# Patient Record
Sex: Male | Born: 1965 | Race: White | Hispanic: No | Marital: Single | Smoking: Current every day smoker
Health system: Southern US, Community
[De-identification: ages and names within clinical notes are randomized; demographics above are authoritative.]

## PROBLEM LIST (undated history)

## (undated) DIAGNOSIS — N390 Urinary tract infection, site not specified: Secondary | ICD-10-CM

## (undated) DIAGNOSIS — A419 Sepsis, unspecified organism: Secondary | ICD-10-CM

## (undated) DIAGNOSIS — F419 Anxiety disorder, unspecified: Secondary | ICD-10-CM

## (undated) DIAGNOSIS — R0600 Dyspnea, unspecified: Secondary | ICD-10-CM

## (undated) DIAGNOSIS — K219 Gastro-esophageal reflux disease without esophagitis: Secondary | ICD-10-CM

## (undated) DIAGNOSIS — F17200 Nicotine dependence, unspecified, uncomplicated: Secondary | ICD-10-CM

## (undated) DIAGNOSIS — F1010 Alcohol abuse, uncomplicated: Secondary | ICD-10-CM

## (undated) DIAGNOSIS — R519 Headache, unspecified: Secondary | ICD-10-CM

## (undated) HISTORY — DX: Anxiety disorder, unspecified: F41.9

## (undated) HISTORY — DX: Alcohol abuse, uncomplicated: F10.10

---
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
## 2003-02-09 LAB — CONVERTED CEMR LAB
ALT: 21 units/L
AST: 23 units/L
Albumin: 4.7 g/dL
BUN: 11 mg/dL
Bilirubin, Direct: 0.1 mg/dL
CO2: 22 meq/L
Calcium: 10 mg/dL
Chloride: 104 meq/L
Glucose, Bld: 111 mg/dL
Potassium: 4.5 meq/L
Sodium: 138 meq/L
Total Protein: 7.7 g/dL

## 2010-06-25 ENCOUNTER — Telehealth: Payer: Self-pay | Admitting: Family Medicine

## 2010-06-25 ENCOUNTER — Ambulatory Visit: Payer: Self-pay | Admitting: Family Medicine

## 2010-06-25 DIAGNOSIS — IMO0002 Reserved for concepts with insufficient information to code with codable children: Secondary | ICD-10-CM

## 2010-07-11 ENCOUNTER — Ambulatory Visit: Payer: Self-pay | Admitting: Internal Medicine

## 2010-08-13 ENCOUNTER — Ambulatory Visit: Payer: Self-pay | Admitting: Internal Medicine

## 2010-09-12 ENCOUNTER — Ambulatory Visit: Payer: Self-pay | Admitting: Internal Medicine

## 2010-09-12 ENCOUNTER — Encounter: Payer: Self-pay | Admitting: Family Medicine

## 2010-11-05 ENCOUNTER — Ambulatory Visit: Payer: Self-pay | Admitting: Internal Medicine

## 2010-11-23 ENCOUNTER — Ambulatory Visit
Admission: RE | Admit: 2010-11-23 | Discharge: 2010-11-23 | Payer: Self-pay | Source: Home / Self Care | Attending: Internal Medicine | Admitting: Internal Medicine

## 2010-12-07 ENCOUNTER — Ambulatory Visit: Admit: 2010-12-07 | Payer: Self-pay | Admitting: Family Medicine

## 2010-12-12 ENCOUNTER — Telehealth: Payer: Self-pay | Admitting: Family Medicine

## 2010-12-17 ENCOUNTER — Encounter: Payer: Self-pay | Admitting: Family Medicine

## 2010-12-25 NOTE — Progress Notes (Signed)
Summary: outgoing call for couseling  Phone Note Outgoing Call Call back at [facility] Phone [phone]   Summary of Call: Called to discuss option of counseling services at [facility].  Pt with financial concerns, but had expressed interest in couseling services.  I wanted to offer couseling at our facility w/ Dr. Luiz Blare if he was interested.  Apparently [facility] has indigent program based on pt's abilty to pay.  Asked to call us back to further discuss. Initial call taken by: Eustaquio Boyden  MD,  June 25, 2010 4:37 PM  Follow-up for Phone Call        pt called back.  not interested in counseling currently 2/2 financial concerns.  will think about it and get back to me next visit about it. Follow-up by: Eustaquio Boyden  MD,  June 25, 2010 4:52 PM

## 2010-12-25 NOTE — Letter (Signed)
Summary: Medical Report Form/NCDMV  Medical Report Form/NCDMV   Imported By: Lanelle Bal 09/20/2010 14:08:39  _____________________________________________________________________  External Attachment:    Type:   Image     Comment:   External Document
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
## 2010-12-25 NOTE — Assessment & Plan Note (Signed)
Summary: ONE MONTH FOLLOW UP / LFW   Vital Signs:  Patient profile:   45 year old male Weight:      145.50 pounds Temp:     97.6 degrees F oral Pulse rate:   64 / minute Pulse rhythm:   regular BP sitting:   110 / 60  (left arm) Cuff size:   regular  Vitals Entered By: Selena Batten Dance CMA (AAMA) (September 12, 2010 8:05 AM) CC: 1 month follow up  Vision Screening:Left eye w/o correction: 20 / 15 Right Eye w/o correction: 20 / 20 Both eyes w/o correction:  20/ 20 Left eye with correction: 20 / 20 Right eye with correction: 20 / 15 Both eyes with correction: 20 / 20        Vision Entered By: Selena Batten Dance CMA Duncan Dull) (September 12, 2010 8:54 AM)   History of Present Illness: CC: f/u depression/anxiety   Feels depression going very well.    Anxiety - still feels gets slight panic attacks when around people.  Still not interested in counseling.  main concern today is form which he brings from [employer] regarding h/o EtOH abuse and mental illness that needs me to fill out, upset because had to fill out for several years but not for last 2 years, asks if I sent in anything to [employer] that made them start requiring form filled out again.  Still abstinent from EtOH since 09/2009.  PHQ 9 = 19->12->6 GAD 7 = 12-->10-->7  smoking - still 2ppd.  precontemplative  Allergies: No Known Drug Allergies  Past History:  Past Medical History: Alcoholism s/p DWI x 1 (charged not convicted) Depression/Anxiety (previously on seroquel, effexor, lexapro, zoloft, xanax, pristiq) Dx with emphysema 3-4 years ago (was on albuterol, no longer), never had PFTs.  doesn't feel can currently afford meds/workup PMH-FH-SH reviewed for relevance  Review of Systems       per HPI  Physical Exam  General:  vitals reviewed and normal.  thin, well developed Psych:  Cognition and judgment appear intact. Alert and cooperative with normal attention span and concentration. No apparent delusions, illusions, hallucinations   Calm, full affect.  not depressed appearing.   Impression & Recommendations:  Problem # 1:  DEPRESSION/ANXIETY (ICD-300.4) Improved depression based on PHQ-9 from 17 to 12 to 6.  Improved anxiety based on GAD-7 with score from 12 to 10 to 7.    Pt has noted substantial improvement on Celexa 40mg .  Not noted much improvement in anxiety on buspar, however GAD-7 with improvement.  Still not intersetd in counseling which I feel he would benefit from talking through several other issues in past (h/o abuse, EtOH, etc).  Would want to check TSH but will hold off for now given financial concerns.  will offer at next visit (TSH + draw =  ~50$).  staying away from benzos given h/o EtOHism.  would consider klonopin if celexa/buspar not controlling anxiety adequately.  increase busar to 7.5mg  two times a day.  question of bipolar per previous provider, however I have not noticed any sxs of such.  will continue to monitor.  also sees psychiatrist, last 2 years ago.  doesn't remember his name.  Problem # 2:  ALCOHOL ABUSE, HX OF (ICD-V11.3) h/o EtOH abuse, abstinent since 09/2009.  very proud of this.  compliant with all meds and taking as directed. I don't feel he should be limited in driving.  filled out forms with patient, passed vision screen and only farsighted so no need for visual  specialist evaluation.  last eye screen was 4 mo ago, told didn't need glasses to drive only to read.  Complete Medication List: 1)  Citalopram Hydrobromide 40 Mg Tabs (Citalopram hydrobromide) .... One by mouth daily for depression/anxiety 2)  Buspirone Hcl 7.5 Mg Tabs (Buspirone hcl) .... One in am and one at night for anxiety  Patient Instructions: 1)  return in 2-3 months for follow up, sooner if needed. 2)  increase buspar to one in AM and one at PM   3)  Good to see you today.  call clinic with quesitons. Prescriptions: CITALOPRAM HYDROBROMIDE 40 MG TABS (CITALOPRAM HYDROBROMIDE) one by mouth daily for  depression/anxiety  #30 x 3   Entered and Authorized by:   Eustaquio Boyden  MD   Signed by:   Eustaquio Boyden  MD on 09/12/2010   Method used:   Electronically to        [facility]* (retail)       [address]       [place], [place]  [address]       Ph: [phone]       Fax: [phone]   RxID:   [id number] BUSPIRONE HCL 7.5 MG TABS (BUSPIRONE HCL) one in am and one at night for anxiety  #60 x 3   Entered and Authorized by:   Eustaquio Boyden  MD   Signed by:   Eustaquio Boyden  MD on 09/12/2010   Method used:   Electronically to        [facility]* (retail)       [address]       [place], [place]  [address]       Ph: [phone]       Fax: [phone]   RxID:   [id number]    Orders Added: 1)  Est. Patient Level III [93716]    Current Allergies (reviewed today): No known allergies

## 2010-12-25 NOTE — Assessment & Plan Note (Signed)
Summary: ONE MONTH FOLLOW UP / LFW   Vital Signs:  Patient profile:   45 year old male Weight:      144.75 pounds Temp:     97.9 degrees F oral Pulse rate:   60 / minute Pulse rhythm:   regular BP sitting:   108 / 70  (left arm) Cuff size:   regular  Vitals Entered By: Selena Batten Dance CMA Duncan Dull) (August 13, 2010 8:05 AM) CC: Follow up   History of Present Illness: CC: f/u anxiety/depression  Scott Fitzgerald presents for 1 mo f/u anxiety/depression.  On celexa 40mg  daily feels doing overall better. Still getting panic attacks occasionally and feeling restless.  Feels drained on celexa - low energy and sleeping too much.  Does walk some but feels could walk more.  Notes sleeping during day.  On MVI and vit E.  Has lost account, worried about work.  No HI.  + mild SI but no plan.  Doesn't think would do anything.  Noted 6 lb weight gain.  Still not interested in counseling.  PHQ 9 = 12 (improved from 19 last visit) GAD 7 = 10 (from 12)  smoking - still 2ppd.  precontemplative  Allergies (verified): No Known Drug Allergies PMH-FH-SH reviewed for relevance  Social History: Reviewed history from 06/25/2010 and no changes required. 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 +2 ppd smoker remote MJ use + EtOH abuse, sober since 09/2009  Review of Systems       per HPI  Physical Exam  General:  vitals reviewed and normal.  thin, well developed, fidgety Psych:  Cognition and judgment appear intact. Alert and cooperative with normal attention span and concentration. No apparent delusions, illusions, hallucinations  Calm, full affect.  not depressed appearing.   Impression & Recommendations:  Problem # 1:  DEPRESSION/ANXIETY (ICD-300.4) Improved depression based on PHQ-9 from 17 to 12.  Improved anxiety based on GAD-7 with score from 12 to 10.    Pt has noted substantial improvement on Celexa 40mg .  Requests additional anxiety med to help with anxiety attacks and  becaues currently avoiding meeting people, which he needs to do for work.  Advised to continue thinking about counseling as I feel he would benefit from talking through several other issues in past (h/o abuse, EtOH, etc).  Also advised to sleep less during day so he has productive nightly rest.  Would want to check TSH but will hold off for now given financial concerns.  will offer at next visit (TSH + draw =  ~50$).  staying away from benzos given h/o EtOHism.  would consider klonopin if celexa/buspar not controlling anxiety adequately.  Start buspar 10mg  nightly.  question of bipolar per previous provider, however I have not noticed any sxs of such.  will continue to monitor.  Problem # 2:  TOBACCO ABUSE (ICD-305.1) Encouraged smoking cessation.  Pt has quit line.  Problem # 3:  ALCOHOL ABUSE (ICD-305.00)  sober x 10 1/2 mo.  would stay away from benzo's.  Problem # 4:  Preventive Health Care (ICD-V70.0) declines flu.  Complete Medication List: 1)  Citalopram Hydrobromide 40 Mg Tabs (Citalopram hydrobromide) .... One by mouth daily for depression/anxiety 2)  Buspirone Hcl 10 Mg Tabs (Buspirone hcl) .... Take one by mouth at bedtime  Patient Instructions: 1)  Return in 1 month for follow up. 2)  Start Buspar 10mg  at night 3)  Start going outside more to exercise during day and try to sleep only at  night.  goal 8-9 hours a day. 4)  Keep thinking about quitting, let us know if you want help. 5)  Pleasure to see you today.  Call clinic with quesitons. Prescriptions: CITALOPRAM HYDROBROMIDE 40 MG TABS (CITALOPRAM HYDROBROMIDE) one by mouth daily for depression/anxiety  #30 x 3   Entered and Authorized by:   Eustaquio Boyden  MD   Signed by:   Eustaquio Boyden  MD on 08/13/2010   Method used:   Electronically to        [facility]* (retail)       [address]       [place], [place]  [address]       Ph: [phone]       Fax: [phone]   RxID:    [id number] BUSPIRONE HCL 10 MG TABS (BUSPIRONE HCL) take one by mouth at bedtime  #30 x 1   Entered and Authorized by:   Eustaquio Boyden  MD   Signed by:   Eustaquio Boyden  MD on 08/13/2010   Method used:   Electronically to        [facility]* (retail)       [address]       [place], [place]  [address]       Ph: [phone]       Fax: [phone]   RxID:   [id number]   Current Allergies (reviewed today): No known allergies

## 2010-12-27 NOTE — Miscellaneous (Signed)
Summary: Advair addition to med list.  Clinical Lists Changes  Medications: Added new medication of ADVAIR DISKUS 100-50 MCG/DOSE AEPB (FLUTICASONE-SALMETEROL) 1 puff two times a day. Rinse after use.     Prior Medications: TRAZODONE HCL 50 MG TABS (TRAZODONE HCL) take one nightly EFFEXOR XR 37.5 MG XR24H-CAP (VENLAFAXINE HCL) one daily for 2 weeks then 2 in am. VENTOLIN HFA 108 (90 BASE) MCG/ACT AERS (ALBUTEROL SULFATE) 2 puffs q6 hours as needed wheezing/sob ADVAIR DISKUS 100-50 MCG/DOSE AEPB (FLUTICASONE-SALMETEROL) 1 puff two times a day. Rinse after use. Current Allergies: No known allergies

## 2010-12-27 NOTE — Assessment & Plan Note (Signed)
Summary: NERVES/ANXIETY/JRR   Vital Signs:  Patient profile:   45 year old male Weight:      151.75 pounds Temp:     97.8 degrees F oral Pulse rate:   64 / minute Pulse rhythm:   regular BP sitting:   110 / 80  (left arm) Cuff size:   regular  Vitals Entered By: Selena Batten Dance CMA Duncan Dull) (November 05, 2010 8:05 AM) CC: Nerves/Anxiety   History of Present Illness: CC: nerves are bad  Anxiety bad, legs hurting, nauseated.  Thanksgiving - couldn't go to family's house because of anxiety.  When around people feels anxious.  weight up 6 lbs.  Work bad - thinks about it, then becomes cycle where he over thinks things, doesn't let him worry well.  Thinks buspar causing leg pain and HA.  Tried to back off.  Thinks celexa not really helping.  Having trouble starting to sleep.  Has been on trazodone and doing well.  + daily HA - taking 3 aspirins daily.  Frontal headache.  Still abstaining from drinking.  smoking has increased.  Allergies: No Known Drug Allergies  Past History:  Past Medical History: Last updated: 09/12/2010 Alcoholism s/p DWI x 1 (charged not convicted) Depression/Anxiety (previously on seroquel, effexor, lexapro, zoloft, xanax, pristiq) Dx with emphysema 3-4 years ago (was on albuterol, no longer), never had PFTs.  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 +2 ppd smoker remote MJ use + EtOH abuse, sober since 09/2009  Review of Systems       per HPI  Physical Exam  General:  vitals reviewed and normal.  well developed Lungs:  Normal respiratory effort, chest expands symmetrically. Lungs are clear to auscultation, no crackles or wheezes. Heart:  Normal rate and regular rhythm. S1 and S2 normal without gallop, murmur, click, rub or other extra sounds. Psych:  Cognition and judgment appear intact. Alert and cooperative with normal attention span and  concentration. No apparent delusions, illusions, hallucinations  Calm, full affect.  not depressed appearing.   Impression & Recommendations:  Problem # 1:  DEPRESSION/ANXIETY (ICD-300.4) Assessment Deteriorated deteriorated based on discussion, sxs.  didn't tolerate buspar - HA, leg pain, nausea.  Stop.  Start trazodone to help with sleep.  Pt desires to change from celexa (doesn't feel helping any more) to effexor.  would like better control of anxiety.  could consider augmentation with seroquel.  again encouraged psychotherapy, pt doesn't want or feel could afford.  RTC 1 mo.    A total of 25 minutes were spent face-to-face with the patient during this encounter and over half of that time was spent on counseling and coordination of care   previous scores - PHQ-9 from 17 to 12 to 6.  Improved anxiety based on GAD-7 with score from 12 to 10 to 7.  today not done.  question of bipolar per previous provider, however I have not noticed any sxs of such.  will continue to monitor.  Complete Medication List: 1)  Citalopram Hydrobromide 20 Mg Tabs (Citalopram hydrobromide) .... One daily for 2 weeks then stop 2)  Trazodone Hcl 50 Mg Tabs (Trazodone hcl) .... Take one nightly 3)  Effexor Xr 37.5 Mg Xr24h-cap (Venlafaxine hcl) .... One daily for 2 weeks then 2 in am.  Patient Instructions: 1)  Return in 1 month for follow up. 2)  Stop buspar. 3)  Decrease celexa to 20mg  daily x 2 weeks.  Then stop. 4)  Start Effexor XR 37.5mg  one daily x 2 weeks. Then increase to Two pills in am. 5)  Start trazodone 50mg  nightly for sleep. 6)  Think about counseling. Prescriptions: EFFEXOR XR 37.5 MG XR24H-CAP (VENLAFAXINE HCL) one daily for 2 weeks then 2 in am.  #60 x 1   Entered and Authorized by:   Eustaquio Boyden  MD   Signed by:   Eustaquio Boyden  MD on 11/05/2010   Method used:   Electronically to        [facility]* (retail)       [address]       [place], [place]   [address]       Ph: [phone]       Fax: [phone]   RxID:   [id number] CITALOPRAM HYDROBROMIDE 40 MG TABS (CITALOPRAM HYDROBROMIDE) one by mouth daily for depression/anxiety  #30 x 3   Entered and Authorized by:   Eustaquio Boyden  MD   Signed by:   Eustaquio Boyden  MD on 11/05/2010   Method used:   Electronically to        [facility]* (retail)       [address]       [place], [place]  [address]       Ph: [phone]       Fax: [phone]   RxID:   [id number] TRAZODONE HCL 50 MG TABS (TRAZODONE HCL) take one nightly  #30 x 3   Entered and Authorized by:   Eustaquio Boyden  MD   Signed by:   Eustaquio Boyden  MD on 11/05/2010   Method used:   Electronically to        [facility]* (retail)       [address]       [place], [place]  [address]       Ph: [phone]       Fax: [phone]   RxID:   [id number]    Orders Added: 1)  Est. Patient Level IV [76195]    Current Allergies (reviewed today): No known allergies

## 2010-12-27 NOTE — Progress Notes (Signed)
Summary: needs samples of advair  Phone Note Call from Patient Call back at [facility] Phone [phone]   Caller: Patient Summary of Call: Pt states he was given 2 samples of advair at last office visit and he is asking for more, says he needs 2 to last him a month.  He says this really helps but he cant afford prescription or office visit.  There is no mention in pt's chart that he was given advair, pt doesnt know the dose but he said the disk was purple.  Initial call taken by: Lowella Petties CMA, AAMA,  December 12, 2010 11:01 AM  Follow-up for Phone Call        It's in the last note.  Please have patient verify the dose and please give him 1 months of sampes if available.  1 puff two times a day. Rinse after use.  Please add to med list.  Follow-up by: Crawford Givens MD,  December 12, 2010 12:34 PM  Additional Follow-up for Phone Call Additional follow up Details #1::        Called patient to ask dosage.  He doesn't think he still has one of the canisters; he says he thinks he threw it away.  He asks if he could  have whatever dosage we might have on hand? He is going to try to find the canister and call back.  Lugene Fuquay CMA (AAMA)  December 12, 2010 1:13 PM   Patient called back and siad that he has 150. Melody Comas  December 12, 2010 1:37 PM     Additional Follow-up for Phone Call Additional follow up Details #2::    I looked and we don't have any advair of any strength.  I would defer to Dr. Reece Agar next week about what he would prefer to do.  I don't want to change his meds w/o having seen the patient.  Follow-up by: Crawford Givens MD,  December 12, 2010 1:36 PM  Additional Follow-up for Phone Call Additional follow up Details #3:: Details for Additional Follow-up Action Taken: Left message on voicemail  in detail.  Personalized VM. Lugene Fuquay CMA (AAMA)  December 12, 2010 4:25 PM   plz call and notify samples of advair available for pickup.   1 puff two times a day. Rinse after use.   Please add to med list.   Eustaquio Boyden  MD  December 17, 2010 8:24 AM   Left message notifying patient of samples that will be up front for pick up. Added to med list. Additional Follow-up by: Janee Morn CMA Duncan Dull),  December 17, 2010 8:57 AM

## 2010-12-27 NOTE — Assessment & Plan Note (Signed)
Summary: ASTHMA FLARE UP/ 2:45   Vital Signs:  Patient profile:   45 year old male Weight:      146 pounds O2 Sat:      97 % on Room air Temp:     98.0 degrees F oral Pulse rate:   76 / minute Pulse rhythm:   regular BP sitting:   130 / 70  (left arm) Cuff size:   regular  Vitals Entered By: Selena Batten Dance CMA (AAMA) (November 23, 2010 2:18 PM)  O2 Flow:  Room air CC: Asthma and/or anxiety Comments Feels like he can't catch his breath at night. Anxiety is getting worse.   History of Present Illness: CC: asthma/anxiety?  Tuesday night trouble breathing, had to call EMT.  given breathing treatment.  told had asthma or emphysema by prior PCP.  Now losing voice.  No fevers/chills, congestion, cough, cold sxs.  No sick contacts.  worsening panic attacks - vomiting, sweating, sob, shaking more.  No chest tightness.  + chest burning heart burn.  peptobismol doesn't help vomiting.  smoking 1 1/2 -2 ppd.  Current Medications (verified): 1)  Trazodone Hcl 50 Mg Tabs (Trazodone Hcl) .... Take One Nightly 2)  Effexor Xr 37.5 Mg Xr24h-Cap (Venlafaxine Hcl) .... One Daily For 2 Weeks Then 2 in Am.  Allergies (verified): No Known Drug Allergies  Past History:  Past Medical History: Alcoholism s/p DWI x 1 (charged not convicted) Depression/Anxiety (previously on seroquel, effexor, lexapro, zoloft, xanax, pristiq, celexa) Dx with emphysema/asthma 3-4 years ago (was on albuterol, no longer), never had PFTs.  doesn't feel can currently afford meds/workup  Social History: Reviewed history from 06/25/2010 and no changes required. 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 +2 ppd smoker remote MJ use + EtOH abuse, sober since 09/2009  Review of Systems       per HPI  Physical Exam  General:  vitals reviewed and normal.  well developed Head:  Normocephalic and atraumatic without obvious abnormalities.  Eyes:  PERRLA, EOMI, no injection Ears:  Tms clear  bilaterally Mouth:  Oral mucosa and oropharynx without lesions or exudates.  poor dentition Neck:  No deformities, masses, or tenderness noted. Lungs:  coarse breath sounds throughout, + some wheezing as well as mild rhonchi Heart:  Normal rate and regular rhythm. S1 and S2 normal without gallop, murmur, click, rub or other extra sounds. Pulses:  2+ rad pulses, brisk cap refill Extremities:  No clubbing, cyanosis, edema, or deformity noted with normal full range of motion of all joints.   Psych:  Cognition and judgment appear intact. Alert and cooperative with normal attention span and concentration. No apparent delusions, illusions, hallucinations  Calm, full affect.  not depressed appearing.   Impression & Recommendations:  Problem # 1:  DYSPNEA (ICD-786.05) previous dx emphysema.  pt thinks has asthma as well.  more likely COPD/emphysema given long smoking hsitory..  pt doesn't feel can afford w/u currently.  start albuterol, advair to see if can get better control of breathing condition.  consider PF pre/post next visit.  O2 sat 97% RA today.  Problem # 2:  TOBACCO ABUSE (ICD-305.1)  Encouraged smoking cessation.  pt in action today.  discussed chantix, doesn't think could handle possible side effects curretnly, opts to try e cigarrette first.  Problem # 3:  DEPRESSION/ANXIETY (ICD-300.4) along with panic attacks now.  Taking trazodone as needed. advised start daily, continue effexor.  if not better, would consider psych although may not be able to afford.  could consider augmentation with seroquel.  again encouraged psychotherapy, pt doesn't want or feel could afford.    A total of 25 minutes were spent face-to-face with the patient during this encounter and over half of that time was spent on counseling and coordination of care   previous scores - PHQ-9 from 17 to 12 to 6.  Improved anxiety based on GAD-7 with score from 12 to 10 to 7.  today not done.  question of bipolar per  previous provider, however I have not noticed any sxs of such.  will continue to monitor.  Complete Medication List: 1)  Trazodone Hcl 50 Mg Tabs (Trazodone hcl) .... Take one nightly 2)  Effexor Xr 37.5 Mg Xr24h-cap (Venlafaxine hcl) .... One daily for 2 weeks then 2 in am. 3)  Ventolin Hfa 108 (90 Base) Mcg/act Aers (Albuterol sulfate) .... 2 puffs q6 hours as needed wheezing/sob  Patient Instructions: 1)  Restart albuterol as needed for breathing. 2)  Start advair twice daily. 3)  Nicotine replacement - look into patches 1 /day or gum. 4)  Let us know how you are doing. Prescriptions: VENTOLIN HFA 108 (90 BASE) MCG/ACT AERS (ALBUTEROL SULFATE) 2 puffs q6 hours as needed wheezing/sob  #1 x 3   Entered and Authorized by:   Eustaquio Boyden  MD   Signed by:   Eustaquio Boyden  MD on 11/23/2010   Method used:   Electronically to        [facility]* (retail)       [address]       [place], [place]  [address]       Ph: [phone]       Fax: [phone]   RxID:   [id number]    Orders Added: 1)  Est. Patient Level IV [27253]    Current Allergies (reviewed today): No known allergies   Appended Document: ASTHMA FLARE UP/ 2:45 (changed to lvl 3)

## 2011-02-07 ENCOUNTER — Encounter: Payer: Self-pay | Admitting: Family Medicine

## 2011-02-07 DIAGNOSIS — J439 Emphysema, unspecified: Secondary | ICD-10-CM | POA: Insufficient documentation

## 2011-02-07 DIAGNOSIS — F418 Other specified anxiety disorders: Secondary | ICD-10-CM

## 2011-02-07 DIAGNOSIS — F419 Anxiety disorder, unspecified: Secondary | ICD-10-CM | POA: Insufficient documentation

## 2011-02-07 DIAGNOSIS — J45909 Unspecified asthma, uncomplicated: Secondary | ICD-10-CM | POA: Insufficient documentation

## 2011-02-07 DIAGNOSIS — F1011 Alcohol abuse, in remission: Secondary | ICD-10-CM | POA: Insufficient documentation

## 2011-02-07 DIAGNOSIS — F1010 Alcohol abuse, uncomplicated: Secondary | ICD-10-CM

## 2011-07-05 ENCOUNTER — Other Ambulatory Visit: Payer: Self-pay | Admitting: *Deleted

## 2011-07-05 NOTE — Telephone Encounter (Signed)
Ok to refill in Dr. Sharen Hones' absence?

## 2011-07-07 MED ORDER — TRAZODONE HCL 50 MG PO TABS: 50.0000 mg | ORAL_TABLET | Freq: Every day | ORAL | Status: DC

## 2011-07-07 NOTE — Telephone Encounter (Signed)
Have him schedule f/u with PMD.  rx sent.

## 2011-07-08 ENCOUNTER — Other Ambulatory Visit: Payer: Self-pay | Admitting: *Deleted

## 2011-07-08 MED ORDER — TRAZODONE HCL 50 MG PO TABS: 50.0000 mg | ORAL_TABLET | Freq: Every day | ORAL | Status: DC

## 2011-07-08 NOTE — Telephone Encounter (Signed)
Spoke with patient's grandmother. DPR form signed giving permission. She just said he was nervous and trembling. He didn't sleep last night but did go to work today. She said she is not worried about SI/HI and isn't afraid for her safety. She just said he was very nervous/anxious. Dr. Para March had actually refilled this Friday in your absence, so it was already at the pharmacy for him.

## 2011-07-08 NOTE — Telephone Encounter (Signed)
Noted. Thanks.  Needs ov.

## 2011-07-08 NOTE — Telephone Encounter (Signed)
Ok to send in.  Can we call pt's grandmother for update, reminding her we cannot discuss med record with her unless he's signed form permitting [place] to.

## 2011-07-08 NOTE — Telephone Encounter (Signed)
Pt's grandmother states pt has been out of this for several weeks and is in a bad way, very nervous.  She says he is about to have a nervous breakdown and she would like for you to call her when you can.

## 2011-07-12 NOTE — Telephone Encounter (Signed)
Scott Fitzgerald, has pt been notified that he needs office visit?

## 2011-07-12 NOTE — Telephone Encounter (Signed)
Yes. I left him a message and I am waiting on a return call from him. Thanks for checking!

## 2011-07-18 ENCOUNTER — Ambulatory Visit (INDEPENDENT_AMBULATORY_CARE_PROVIDER_SITE_OTHER): Payer: Self-pay | Admitting: Family Medicine

## 2011-07-18 ENCOUNTER — Encounter: Payer: Self-pay | Admitting: Family Medicine

## 2011-07-18 VITALS — BP 122/80 | HR 104 | Temp 98.2°F | Wt 142.8 lb

## 2011-07-18 DIAGNOSIS — F4010 Social phobia, unspecified: Secondary | ICD-10-CM

## 2011-07-18 DIAGNOSIS — F410 Panic disorder [episodic paroxysmal anxiety] without agoraphobia: Secondary | ICD-10-CM

## 2011-07-18 HISTORY — DX: Social phobia, unspecified: F40.10

## 2011-07-18 MED ORDER — CLONAZEPAM 0.5 MG PO TABS: 0.5000 mg | ORAL_TABLET | Freq: Two times a day (BID) | ORAL | Status: DC | PRN

## 2011-07-18 MED ORDER — ALPRAZOLAM 0.5 MG PO TABS: 0.5000 mg | ORAL_TABLET | Freq: Every day | ORAL | Status: DC | PRN
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
## 2011-07-18 NOTE — Patient Instructions (Addendum)
Continue trazodone at night. Start klonopin 0.5mg  twice daily to help with anxiety level. Xanax for severe anxiety.  This is just temporary measure while klonopin takes effect. Cut back on smoking! Return to see me in 1 month.

## 2011-07-18 NOTE — Assessment & Plan Note (Signed)
Acute panic attack in setting of anxiety > depression. Trial of several meds in past, poor response. States has responded well to xanax in past, however hesitance given h/o EtOH abuse. Discussed similarities of benzos and EtOH, however given pt has failed several other antidepressants/antianxiety meds, start klonopin bid, xanax #10 provided for temporary relief of severe anxiety attacks. Advised to call me in next few days with update, to return in 1-3 mo for f/u. Pt feels anxiety attacks impeding ability to function in social setting, feels on verge of financial ruin 2/2 anxiety, asks about disability.  Advised to look into this.

## 2011-07-22 ENCOUNTER — Telehealth: Payer: Self-pay | Admitting: Family Medicine

## 2011-07-22 MED ORDER — ALPRAZOLAM 0.5 MG PO TABS: 0.5000 mg | ORAL_TABLET | Freq: Two times a day (BID) | ORAL | Status: DC | PRN

## 2011-07-22 NOTE — Telephone Encounter (Signed)
Rx called in as directed and message left notifying patient. 

## 2011-07-22 NOTE — Telephone Encounter (Signed)
Pt requested call back this am - called at [phone], no answer.  Will route to [employer].

## 2011-07-22 NOTE — Telephone Encounter (Signed)
Spoke with patient and advised him to take meds only as prescribed. He said he hasn't noticed any sedation. I told him to take 2 in the AM and 2 in the PM. He only has 2 of the xanax left because of severe anxiety attacks since his visit with you the other day. He said something is starting to work though because he is starting to feel like he can socialize and get out and work now. He has worked some over the last few days. He did ask for a refill on the xanax and the klonopin. I told him I would ask about the xanax but since he still had 37 of the klonopin left, it couldn't be refilled just yet. I also reminded him not to take more than what you tell him because if he runs out too early, it can't be refilled. He verbalized understanding and said he would call with an update in 3 days.

## 2011-07-22 NOTE — Telephone Encounter (Signed)
Any sedating effect on med?  Needs to take med only as prescribed.  May increase klonopin to 2 pills in am and 2 in evening (4 total daily instead of 2).  Call me in 3 days with update. How many xanax left?

## 2011-07-22 NOTE — Telephone Encounter (Signed)
Noted.  May phone in xanax #20.

## 2011-07-22 NOTE — Telephone Encounter (Signed)
Patient was told to call and give you an update on how he is doing. Patient states that the Clonzaepam that he was given is not strong enough and he has taken more than what was prescribed. Patient got #60 five days ago and only has 37 left. Patient thinks that he needs something stronger.

## 2011-07-26 ENCOUNTER — Telehealth: Payer: Self-pay | Admitting: *Deleted

## 2011-07-26 MED ORDER — CLONAZEPAM 0.5 MG PO TABS: 1.0000 mg | ORAL_TABLET | Freq: Two times a day (BID) | ORAL | Status: DC

## 2011-07-26 NOTE — Telephone Encounter (Signed)
Please verify he's off effexor (was on med list). Have him take 2 klonopin twice daily.  Continue current xanax dose as is over weekend. To call us Tuesday with update.   If not improved, will taper off klonopin and restart celexa, using xanax as breakthrough anxiety med.

## 2011-07-26 NOTE — Telephone Encounter (Signed)
Called and had to leave a message notifying the patient. Instructed him to call on Tuesday with any update and to verify about effexor. Instructed him to go to [facility] or ER if he had any major problems over the long weekend.

## 2011-07-26 NOTE — Telephone Encounter (Signed)
Spoke with patient. He says his anxiety is still not getting much better. He feels like his mind is racing all the time and he can't focus. He says with his job and financial issues he is staying keyed up all the time. When he has to be around people is when things are the worst. He had to call in sick today because of it. The klonopin works a little but not much. He feels like he needs something else to go with it. He has already used 10 of the xanax that were prescribed to him on the 27th. He said he has taken up to 3 at a time. I told him he COULD NOT do that anymore due to risk of overdose. He verbalized understanding but said that was the only thing that seemed to work really good for him. He kept asking if he could have something that worked immediately and he suggested 1 mg xanax.  I told him I wasn't sure if you would prescribe that or try something totally different. I told him one of [place] would call him later this afternoon.

## 2011-07-31 ENCOUNTER — Telehealth: Payer: Self-pay | Admitting: *Deleted

## 2011-07-31 MED ORDER — CLONAZEPAM 0.5 MG PO TABS: 1.5000 mg | ORAL_TABLET | Freq: Two times a day (BID) | ORAL | Status: DC

## 2011-07-31 MED ORDER — CITALOPRAM HYDROBROMIDE 20 MG PO TABS: 20.0000 mg | ORAL_TABLET | Freq: Every day | ORAL | Status: DC

## 2011-07-31 NOTE — Telephone Encounter (Signed)
Pt taking 2 pills of 0.5 mg bid. Not controlled anxiety. I want [place] to start celexa - 20mg  daily for better control of anxiety.  Has tolerated celexa in past, hopeful for improvement with klonopin on board. Will also increase to 3 pills of klonopin 0.5mg  twice daily.  To call us 1 wk prior to running out of current klonopin script.

## 2011-07-31 NOTE — Telephone Encounter (Signed)
Message left notifying patient to increase to 3 klonopin twice daily and to take celexa once daily. Instructed to call 1 week before running out of the klonopin for a refill of a different strength. Instructed to call with any questions or concerns.

## 2011-07-31 NOTE — Telephone Encounter (Signed)
Patient called and said he feels like the klonopin isn't strong enough for him. He is having to take his 2nd dose around 1 or 2 PM. He thinks he needs a stronger dose to help him go longer in between doses.

## 2011-08-09 ENCOUNTER — Telehealth: Payer: Self-pay | Admitting: *Deleted

## 2011-08-09 MED ORDER — CITALOPRAM HYDROBROMIDE 20 MG PO TABS: 20.0000 mg | ORAL_TABLET | Freq: Every day | ORAL | Status: DC

## 2011-08-09 MED ORDER — CLONAZEPAM 1 MG PO TABS: 1.5000 mg | ORAL_TABLET | Freq: Two times a day (BID) | ORAL | Status: DC

## 2011-08-09 NOTE — Telephone Encounter (Signed)
Don't recommend anything OTC to help with ED.  Will refill klonopin, increase dose to 1mg  so will need 1 1/2 pills bid.  Please phone in script in chart. Continue celexa, give this med more time to work.  Will remove xanax from list as that was only temporary measure while we got these longer acting meds on board.

## 2011-08-09 NOTE — Telephone Encounter (Signed)
Patient called with an update. He said he has had 4 short attacks since new dose of klonopin. He has been taking the celexa and thinks it's helped some. He had to increase the klonopin to 4 pills sometimes with the attacks which he said helped a lot. I advised him again NOT TO DO THIS due to increasing the risk of OD. I told him when he feels like he needs more, he needs to call the office. He said he will need a refill on the klonopin next week due to increasing it himself.   He is also saying he is having some ED issues now. He knows he won't be able to afford a prescription aid and was wondering if there was something OTC you could recommend or if there were samples of anything. I told him I would let him know your thoughts on things with this and call him back.

## 2011-08-09 NOTE — Telephone Encounter (Signed)
Rx called in and patient notified. He is going to continue the celexa and also ask his pharmacist about the cost of an ED med without insurance.

## 2011-08-12 ENCOUNTER — Telehealth: Payer: Self-pay | Admitting: *Deleted

## 2011-08-12 NOTE — Telephone Encounter (Signed)
Patient is now calling and saying that 1.5 of the klonopin twice daily isn't working. He has been taking 1.5 three times daily (because that's what the pharmacy told him-he didn't read the bottle) and he says it isn't lasting long enough and he is having to take 1/2 of his trazodone three times daily with the klonopin to get through the day. He is still taking the celexa. He is asking to take 2 mg of the klonopin three times daily, so that he can make it.   I told him it would be tomorrow before he heard back from me.

## 2011-08-13 NOTE — Telephone Encounter (Signed)
Spoke with patient. I advised him if he continued to increase his meds on his own, then you would not be able to prescribe them anymore. I told him that in essence he was breaking the law by doing that and putting his health and your license in jeopardy. He verbalized understanding. I told him from this point forward to take 2 pills in the morning and 2 pills in the evening. I advised him not to increase them anymore regardless of the circumstances. I advised him that if he increased them further than this and if he ran out early, we would not be able to refill them.  I told him to call me when he had five days worth left. I scheduled an appointment for him for the 2nd week in October. He said he has some court appearances and wasn't sure how his finances were going to be. He thought he would be able to afford coming in then. I told him he had to be seen prior to further refills. He verbalized understanding. He repeated the new dosing instruction of 2 pills in the morning and 2 in the evening-no more than this and understood no early refills.

## 2011-08-13 NOTE — Telephone Encounter (Addendum)
3 times daily is not how I prescribed it regardless of what pharmacy told him.  If continues to self titrate, I will not be able to continue prescribing and will need to wean off.  The most i'm comfortable prescribing is 2mg  twice daily for total of 4mg  /day.  Has 1mg  at home, should have enough to last him past 08/19/2011.  To call me when has 5 days left of meds and I will refill higher dose then, not prior.  (around 08/20/2011)  Please have him take medicines only as prescribed. Please have him come in for office visit.

## 2011-08-13 NOTE — Telephone Encounter (Signed)
Will await call for refill.

## 2011-08-20 ENCOUNTER — Other Ambulatory Visit: Payer: Self-pay | Admitting: *Deleted

## 2011-08-20 MED ORDER — CLONAZEPAM 2 MG PO TABS: 2.0000 mg | ORAL_TABLET | Freq: Two times a day (BID) | ORAL | Status: DC | PRN

## 2011-08-20 NOTE — Telephone Encounter (Signed)
Ok to send in. Thanks

## 2011-08-20 NOTE — Telephone Encounter (Signed)
Patient called requesting a refill on his new dose of klonopin-2 mg BID to [facility]

## 2011-08-21 NOTE — Telephone Encounter (Signed)
Rx called in as directed.   

## 2011-09-03 ENCOUNTER — Ambulatory Visit (INDEPENDENT_AMBULATORY_CARE_PROVIDER_SITE_OTHER): Payer: Self-pay | Admitting: Family Medicine

## 2011-09-03 ENCOUNTER — Other Ambulatory Visit: Payer: Self-pay | Admitting: *Deleted

## 2011-09-03 ENCOUNTER — Encounter: Payer: Self-pay | Admitting: Family Medicine

## 2011-09-03 DIAGNOSIS — R21 Rash and other nonspecific skin eruption: Secondary | ICD-10-CM

## 2011-09-03 DIAGNOSIS — F418 Other specified anxiety disorders: Secondary | ICD-10-CM

## 2011-09-03 DIAGNOSIS — F410 Panic disorder [episodic paroxysmal anxiety] without agoraphobia: Secondary | ICD-10-CM

## 2011-09-03 DIAGNOSIS — F341 Dysthymic disorder: Secondary | ICD-10-CM

## 2011-09-03 HISTORY — DX: Rash and other nonspecific skin eruption: R21

## 2011-09-03 MED ORDER — TRAZODONE HCL 50 MG PO TABS: 50.0000 mg | ORAL_TABLET | Freq: Every day | ORAL | Status: DC

## 2011-09-03 MED ORDER — CITALOPRAM HYDROBROMIDE 40 MG PO TABS: 40.0000 mg | ORAL_TABLET | Freq: Every day | ORAL | Status: DC

## 2011-09-03 NOTE — Assessment & Plan Note (Signed)
See above

## 2011-09-03 NOTE — Telephone Encounter (Signed)
Patient is 2 weeks early - said he had "doubled up, using them as nerve pill".  Okay to refill early?

## 2011-09-03 NOTE — Progress Notes (Signed)
  Subjective:    Patient ID: Scott Fitzgerald, male    DOB: 01/28/1966, 45 y.o.   MRN: [id number]  HPI CC: f/u anxiety, check fot  "I feel great".  Started going to church, listening to [employer] and christian music.  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  As far as anxiety, feels calm around people.  Has run out of trazodone and celexa for 3 days.  Denies ssri withdrawal sxs.  Has refills at pharmacy, hasn't had time to fill.  Advised not to stop SSRI cold [place].  Would like to increase celexa to 40 mg daily.    Longstanding h/o anxiety. Trial in past of Seroquel, effexor, lexapro, zoloft, xanax, pristiq, celexa. Celexa effective at 40mg , but then stopped working.  Foot - rash.  1 mo hx.  Red scaly rash.  Using lamisil, neosporin, triple abx ointment, and aveeno moisturizer.  Itchy.  Has been washing with soap as well.  Previously similar rash, seen by derm at [facility] and started on lamisil but told not fungal.  Review of Systems Per HPI    Objective:   Physical Exam  Nursing note and vitals reviewed. Constitutional: He appears well-developed and well-nourished. No distress.  HENT:  Head: Normocephalic and atraumatic.  Cardiovascular:  Pulses:      Dorsalis pedis pulses are 2+ on the right side, and 2+ on the left side.       Posterior tibial pulses are 2+ on the right side, and 2+ on the left side.  Neurological: No sensory deficit.  Skin: Skin is warm and dry. Rash noted.       Bilateral feet with scaly slightly erythematous rash, pruritic. Soles cracking, scaling, hyperkeratotic R>L.  Maceration interdigital webs bilateral toes  Psychiatric: He has a normal mood and affect. His speech is normal and behavior is normal.       Somewhat tangiential      Assessment & Plan:

## 2011-09-03 NOTE — Patient Instructions (Addendum)
For anxiety - take klonopin 2mg  twice daily.  Trazodone at night.  May increase celexa to 40mg  daily. For foot - stop neosporin and triple antibiotic ointments.  Stop peroxide.  Stop hydrocortisone.  Start CLOTRIMAZOLE or Lotrimin cream twice daily for 4 weeks.  Use antifungal powder in all shoes.  If not better, let me know. Maximum ibuprofen (advil) is 4 three times a day. Keep working on quitting smoking!

## 2011-09-03 NOTE — Assessment & Plan Note (Signed)
Seems consistent with severe tinea pedis R>L. rec start clotrimazole twice daily for 4 wks.   Also use antifungal powder in all shoes. Update me if not improving.

## 2011-09-03 NOTE — Telephone Encounter (Signed)
Ok to refill.  Sent in.  Only should take as directed.

## 2011-09-03 NOTE — Assessment & Plan Note (Addendum)
Improved.  No more panic attacks. states has been self titrating trazodone. Advised again to only use meds as prescribed, to take klonopin 2mg  one in am and in afternoon, trazodone at night.   Also to increase celexa to 40mg  daily - sent in new script. GAD7 = 7/21, improvement overall.

## 2011-09-04 NOTE — Telephone Encounter (Signed)
I called [facility] yesterday morning and advised the refill was okay. I don't know why they called back. The patient was also made aware of the refill yesterday morning.

## 2011-09-05 ENCOUNTER — Telehealth: Payer: Self-pay | Admitting: *Deleted

## 2011-09-05 MED ORDER — ALBUTEROL SULFATE HFA 108 (90 BASE) MCG/ACT IN AERS: 2.0000 | INHALATION_SPRAY | Freq: Four times a day (QID) | RESPIRATORY_TRACT | Status: DC | PRN

## 2011-09-05 NOTE — Telephone Encounter (Signed)
Patient left a message on my voicemail advising he needed a sample of his Advair. He said he had gotten a cold since he was in the other day and was having some SOB. I called patient and told him that I had the sample for him, but that if he was SOB he needed to be evaluated at an [facility] or ER. He said it wasn't bad, and that the Advair worked for him yesterday, he just didn't have anymore. He also requested a refill of his albuterol inhaler. Refill sent into pharmacy.

## 2011-09-13 ENCOUNTER — Telehealth: Payer: Self-pay | Admitting: *Deleted

## 2011-09-13 MED ORDER — TRAMADOL HCL 50 MG PO TABS: 50.0000 mg | ORAL_TABLET | Freq: Two times a day (BID) | ORAL | Status: AC | PRN

## 2011-09-13 NOTE — Telephone Encounter (Signed)
Ensure no fevers/chills, or large ulcers or sores on feet. May send in tramadol for pain. How quickly can he get seen by derm?

## 2011-09-13 NOTE — Telephone Encounter (Signed)
Patient denies fever, chills, ulcers or sores. He is going to dermatologist on Monday. He is seeing Dr. Clide Cliff in [place]. I told him to have them fax the office note to [place]. He said he would. I called the Rx into [facility] in [place], [place] at [phone] as directed per patient request.

## 2011-09-13 NOTE — Telephone Encounter (Signed)
Noted  

## 2011-09-13 NOTE — Telephone Encounter (Signed)
Patient called and said his feet are worse now than when he came in previously. He said it wasn't fungus related because he has been using the fungus med and they have gotten worse. He says it is due to clorox exposure. He said his feet are swollen and turning purple and he is in severe pain up to his knees. He is requesting a pain medication to get him through the weekend and he is going to see a dermatologist ASAP. He is working in [employer] today and will find a pharmacy there for me to call something in if you okay a Rx. I told him it would probably be lunch time before he heard back from me.

## 2011-09-14 ENCOUNTER — Emergency Department: Payer: Self-pay | Admitting: Emergency Medicine

## 2011-09-18 ENCOUNTER — Other Ambulatory Visit: Payer: Self-pay | Admitting: Family Medicine

## 2011-09-18 NOTE — Telephone Encounter (Signed)
Patient called requesting refill. Ok to do?

## 2011-09-18 NOTE — Telephone Encounter (Signed)
Ok to refill 

## 2011-09-19 NOTE — Telephone Encounter (Signed)
Rx called in as directed.   

## 2011-10-03 ENCOUNTER — Telehealth: Payer: Self-pay | Admitting: *Deleted

## 2011-10-03 MED ORDER — BUSPIRONE HCL 7.5 MG PO TABS: 7.5000 mg | ORAL_TABLET | Freq: Two times a day (BID) | ORAL | Status: DC

## 2011-10-03 NOTE — Telephone Encounter (Signed)
Spoke with patient. He said he cannot afford counseling. I advised that he may qualify for patient assistance and if so, that would be a way to get in with a counselor. I advised that it wasn't good for him to keep masking the issues with medications, when he needed to get to the root of the issue and find healthy coping strategies. He said he would look into it and will talk to you about it at his next visit. For the meantime he said he wanted to try the buspar again. I told him it would be sent in to his pharmacy.

## 2011-10-03 NOTE — Telephone Encounter (Signed)
Patient called and wants to know if you would increase his celexa. He is doing better but still having some anxiety issues. He feels that a little more will help him, since he can't increase his klonopin.

## 2011-10-03 NOTE — Telephone Encounter (Signed)
Sent in buspar.  7.5mg  twice daily.  Watch for dizziness.

## 2011-10-03 NOTE — Telephone Encounter (Signed)
Patient notified

## 2011-10-03 NOTE — Telephone Encounter (Signed)
He is already on max dose of celexa and max dose of klonopin. Next option would be addition of another medicine for anxiety like trial of buspar again or mirtazapine instead of trazadone. Also I continue to recommend counseling for anxiety. Pt may need to come in to discuss.  Is he already set up with patient assistance?

## 2011-10-19 ENCOUNTER — Other Ambulatory Visit: Payer: Self-pay | Admitting: Family Medicine

## 2011-10-20 NOTE — Telephone Encounter (Signed)
Ok to refill.  plz phone in  

## 2011-10-21 NOTE — Telephone Encounter (Signed)
Rx called in as directed.   

## 2011-11-04 ENCOUNTER — Other Ambulatory Visit: Payer: Self-pay | Admitting: Family Medicine

## 2011-11-04 NOTE — Telephone Encounter (Signed)
Too soon, sent back to pharmacy

## 2011-11-14 ENCOUNTER — Telehealth: Payer: Self-pay | Admitting: *Deleted

## 2011-11-14 MED ORDER — BUSPIRONE HCL 7.5 MG PO TABS: 7.5000 mg | ORAL_TABLET | Freq: Three times a day (TID) | ORAL | Status: DC

## 2011-11-14 MED ORDER — CITALOPRAM HYDROBROMIDE 40 MG PO TABS: 40.0000 mg | ORAL_TABLET | Freq: Every day | ORAL | Status: DC

## 2011-11-14 NOTE — Telephone Encounter (Signed)
Spoke with patient and advised him of cost at [facility]. He called and verified and requested that the buspar and celexa be sent there. He said he checked and all of his meds will be cheaper there, so when he is due for refills, he wants everything sent there. Advised [facility] to cancel buspar refill and sent that and celexa to [facility] as requested.
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
## 2011-11-14 NOTE — Telephone Encounter (Signed)
We can go up on buspar, increase to tid dosing, caution for sedation and dizziness. celexa is on 4$ plan at [facility].  i could send there.  30d supply for 4$, 90d supply for 10$. Also ensure did not stop SSRI cold [place] - as that could cause discontinuation sxs - shakey, anxious, tremors, nausea.

## 2011-11-15 ENCOUNTER — Other Ambulatory Visit: Payer: Self-pay | Admitting: *Deleted

## 2011-11-15 MED ORDER — TRAZODONE HCL 50 MG PO TABS: 50.0000 mg | ORAL_TABLET | Freq: Every day | ORAL | Status: DC

## 2011-12-23 ENCOUNTER — Other Ambulatory Visit: Payer: Self-pay | Admitting: Family Medicine

## 2011-12-23 NOTE — Telephone Encounter (Signed)
plz phone in. 

## 2011-12-23 NOTE — Telephone Encounter (Signed)
Rx called in as directed.   

## 2012-01-08 ENCOUNTER — Telehealth: Payer: Self-pay | Admitting: *Deleted

## 2012-01-08 NOTE — Telephone Encounter (Signed)
Patient called and requested early refill on Trazodone. He said he get it filled last week (early) because he lost it and the pharmacy filled it for him. He said he lost it again yesterday and they needed an okay from you for another early refill. I advised that you probably would not refill it early, but that I would ask. He said he has increased his klonopin again on his own to TID because the celexa and buspar aren't helping. He said when he runs out of klonopin, he uses the trazodone during the day. I advised that we had already discussed the dangers of increasing his meds on his own. I reminded him of the possibility of overdosing and death. I also reminded him that you had specifically told him that you would quit writing Rx if he continued to increase meds on his own. He asked me not to tell you, but I told him that I had to tell you because of the dangers/health risks of him doing that. He said he has also been having memory issues and wonders if the meds are causing that. I advised he needed an appt to discuss things with you and that I would call him back to let him know about the trazodone.

## 2012-01-08 NOTE — Telephone Encounter (Signed)
Called to discuss, no answer.  Can we call for clarification?

## 2012-01-08 NOTE — Telephone Encounter (Signed)
Please call patient.  He needs to explain to you how he is taking his MEDS.  He didn't explain the way he is taking the MEDS correctly to [facility].

## 2012-01-09 MED ORDER — TRAZODONE HCL 50 MG PO TABS: 50.0000 mg | ORAL_TABLET | Freq: Every day | ORAL | Status: DC

## 2012-01-09 NOTE — Telephone Encounter (Signed)
Spoke with pt. Still taking buspar and celexa. Feels klonopin and trazodone work better.  Sometimes takes 3 klonopin/day, other times takes trazodone prn anxiety. Again recommended psychiatry referral for anxiety. Advised to come in for appointment to discuss. Sent in trazodone refill.

## 2012-01-24 ENCOUNTER — Other Ambulatory Visit: Payer: Self-pay | Admitting: Family Medicine

## 2012-01-24 NOTE — Telephone Encounter (Signed)
OK to refill

## 2012-01-24 NOTE — Telephone Encounter (Signed)
Ok to refill.  Pt needs to call us at least 3 days prior to when refills due.

## 2012-01-24 NOTE — Telephone Encounter (Signed)
Rx called in as directed. Advised patient to call at least 3 days prior for further refills to avoid running out of meds.

## 2012-02-24 ENCOUNTER — Other Ambulatory Visit: Payer: Self-pay | Admitting: Family Medicine

## 2012-02-24 NOTE — Telephone Encounter (Signed)
Ok to refill 

## 2012-02-24 NOTE — Telephone Encounter (Signed)
OK to refill

## 2012-02-24 NOTE — Telephone Encounter (Signed)
Pt left v/m to see if [facility] had called about refill (pt did not leave name of med). I left v/m for pt that [facility] had requested refill for Konopin 2 mg and pt could ck with pharmacy on 02/25/12 to see if refill approved.

## 2012-02-25 ENCOUNTER — Other Ambulatory Visit: Payer: Self-pay

## 2012-02-25 NOTE — Telephone Encounter (Signed)
pt left v/m that he only had 1 more Klonopin and wanted to verify med had been called in to [facility]. I called [facility] and confirmed rx is ready for pick up. Left v/m on pts cell phone and left message with pts grandmother med ready for pick up

## 2012-02-25 NOTE — Telephone Encounter (Signed)
Rx called in as directed.   

## 2012-03-12 ENCOUNTER — Ambulatory Visit (INDEPENDENT_AMBULATORY_CARE_PROVIDER_SITE_OTHER): Payer: Self-pay | Admitting: Family Medicine

## 2012-03-12 ENCOUNTER — Encounter: Payer: Self-pay | Admitting: Family Medicine

## 2012-03-12 VITALS — BP 114/70 | HR 80 | Temp 97.6°F | Ht 65.0 in | Wt 147.2 lb

## 2012-03-12 DIAGNOSIS — F418 Other specified anxiety disorders: Secondary | ICD-10-CM

## 2012-03-12 DIAGNOSIS — Z23 Encounter for immunization: Secondary | ICD-10-CM

## 2012-03-12 DIAGNOSIS — F1011 Alcohol abuse, in remission: Secondary | ICD-10-CM

## 2012-03-12 DIAGNOSIS — F341 Dysthymic disorder: Secondary | ICD-10-CM

## 2012-03-12 DIAGNOSIS — Z0000 Encounter for general adult medical examination without abnormal findings: Secondary | ICD-10-CM

## 2012-03-12 NOTE — Assessment & Plan Note (Signed)
Preventative protocols reviewed and updated unless pt declined. Form filled out. Finances are significant barrier to care. Tdap today - asked to waive fee. To take substance abuse part to psych to fill out (needs certified substance abuse counselor to fill out last part of form).

## 2012-03-12 NOTE — Patient Instructions (Addendum)
Form filled out. Good to see you today, call us with questions. If worsening anxiety, I will recommend evaluation by psychiatrist. Continue klonopin and celexa and trazodone as prescribed.

## 2012-03-12 NOTE — Assessment & Plan Note (Signed)
Significant anxiety with less depression per questioning. PHQ9 = 14/27, very difficult to function GAD7 = 12/21 Continue meds as up to now. Pt with h/o EtOH abuse but stable sober and significant anxiety with panic disorder currently stable on klonopin. Had pt sign controlled substance agreement form today.
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
## 2012-03-12 NOTE — Assessment & Plan Note (Signed)
Stable.  Remains sober.

## 2012-03-25 ENCOUNTER — Other Ambulatory Visit: Payer: Self-pay | Admitting: Family Medicine

## 2012-03-25 ENCOUNTER — Other Ambulatory Visit: Payer: Self-pay

## 2012-03-25 MED ORDER — CLONAZEPAM 2 MG PO TABS: 2.0000 mg | ORAL_TABLET | Freq: Two times a day (BID) | ORAL | Status: DC

## 2012-03-25 NOTE — Telephone Encounter (Signed)
Rx called in as directed.   

## 2012-03-25 NOTE — Telephone Encounter (Signed)
plz phone in. 

## 2012-03-25 NOTE — Telephone Encounter (Signed)
Pt request refill Clonazepam 2 mg called into [facility] pharmacy. Pt last seen 03/12/12. Pt can be reached at [phone].

## 2012-04-24 ENCOUNTER — Other Ambulatory Visit: Payer: Self-pay

## 2012-04-24 ENCOUNTER — Other Ambulatory Visit: Payer: Self-pay | Admitting: Family Medicine

## 2012-04-24 NOTE — Telephone Encounter (Signed)
Rx called in as directed.   

## 2012-04-24 NOTE — Telephone Encounter (Signed)
Pt left v/m was out of med and needed this weekend, left no name, name of med or DOB. Left phone #. I left v/m on [phone] to ck with [facility] pharmacy, med refilled earlier today.

## 2012-04-24 NOTE — Telephone Encounter (Signed)
Ok to refill 

## 2013-10-08 ENCOUNTER — Encounter: Payer: Self-pay | Admitting: Family Medicine

## 2013-10-08 ENCOUNTER — Ambulatory Visit (INDEPENDENT_AMBULATORY_CARE_PROVIDER_SITE_OTHER): Payer: Self-pay | Admitting: Family Medicine

## 2013-10-08 VITALS — BP 124/78 | HR 72 | Temp 97.6°F | Ht 65.0 in | Wt 143.5 lb

## 2013-10-08 DIAGNOSIS — F341 Dysthymic disorder: Secondary | ICD-10-CM

## 2013-10-08 DIAGNOSIS — K120 Recurrent oral aphthae: Secondary | ICD-10-CM

## 2013-10-08 DIAGNOSIS — F329 Major depressive disorder, single episode, unspecified: Secondary | ICD-10-CM

## 2013-10-08 DIAGNOSIS — F1011 Alcohol abuse, in remission: Secondary | ICD-10-CM

## 2013-10-08 HISTORY — DX: Recurrent oral aphthae: K12.0

## 2013-10-08 MED ORDER — BUSPIRONE HCL 10 MG PO TABS: 10.0000 mg | ORAL_TABLET | Freq: Two times a day (BID) | ORAL | Status: DC

## 2013-10-08 NOTE — Assessment & Plan Note (Signed)
Initially pt stated he had been abstinent for the last 6 months then I advised I would check urine alcohol screen today given recent DWI charge - and pt admitted to continued current EtOH use. Advised I would not fill out form unless he remains abstinent from alcohol and I provided him today with AA contact information for [facility] and encouraged he establish with them for further support. I will have him return in 67mo for f/u and to fill out form.

## 2013-10-08 NOTE — Patient Instructions (Addendum)
Let's start buspar 10mg  nightly for next week then increase to twice daily for anxiety. For lips - try oragel over the counter.  Continue using chapstick. Return in 1 month for follow up.  I cannot fill out form if you continue drinking. Information on alcoholics anonymous provided today.  [place], [place]. [facility]-[place], [place] Hotline: [phone] Answering Service: [phone] Site: [email or web address]

## 2013-10-08 NOTE — Assessment & Plan Note (Signed)
With poor dentition. Recommended use oragel and f/u with dentist

## 2013-10-08 NOTE — Progress Notes (Signed)
  Subjective:    Patient ID: Scott Fitzgerald, male    DOB: [birth year], 47 y.o.   MRN: [id number]  HPI CC: DMV forms to fill out  Scott Fitzgerald comes in today to discuss DMV forms he needs filled out - medical report. Doing well.  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 Not seen since 02/2012.  H/o panic attacks - doing well.  Off all meds for this.  Still with occasional panic attacks.  Would like to restart buspar.  H/o emphysema - doesn't use meds for this at all. Smoking - 1.5 ppd.   EtOH - abstinent since 03/2013.  S/p DWI remotely (2001), then again 09/2012.  Went through [employer].  Had increased drinking.  Currently with privileged license.  Thinking about restarting AA ([facility]).  Now avoiding bars and avoiding other drinkers. Rec drugs - denies.  Some lip sores that aren't healing.  Has tried OTC lip balm.  Interested in another trial of med.  Past Medical History  Diagnosis Date  . Alcohol abuse     s/p DWI x1, sober since 09/2009  . Anxiety     with panic disorder, previously on Seroquel, effexor, lexapro, zoloft, xanax, pristiq, celexa  . Emphysema     was on albuterol, no longer. never had PFT's. doesnt feel can currently afford meds/workup  . Asthma      Review of Systems Per HPI    Objective:   Physical Exam  Nursing note and vitals reviewed. Constitutional: He appears well-developed and well-nourished. No distress.  HENT:  Mouth/Throat: Oropharynx is clear and moist. No oropharyngeal exudate.  Shallow aphthous ulcers inferior lip  Cardiovascular: Normal rate, regular rhythm, normal heart sounds and intact distal pulses.   No murmur heard. Pulmonary/Chest: Effort normal and breath sounds normal. No respiratory distress. He has no wheezes. He has no rales.  Musculoskeletal: He exhibits no edema.  Skin: Skin is warm and dry. No rash noted.  Psychiatric: He has a normal mood and affect.       Assessment & Plan:

## 2013-10-08 NOTE — Assessment & Plan Note (Signed)
Predominant anxiety with panic attacks in past. interested in restarting buspar - start 10mg  nightly for 1 wk then increase to 10mg  bid.

## 2013-10-08 NOTE — Progress Notes (Signed)
Pre-visit discussion using our clinic review tool. No additional management support is needed unless otherwise documented below in the visit note.  

## 2013-11-08 ENCOUNTER — Encounter: Payer: Self-pay | Admitting: Family Medicine

## 2013-11-08 ENCOUNTER — Ambulatory Visit (INDEPENDENT_AMBULATORY_CARE_PROVIDER_SITE_OTHER): Payer: Self-pay | Admitting: Family Medicine

## 2013-11-08 VITALS — BP 100/60 | HR 66 | Temp 98.1°F | Wt 146.2 lb

## 2013-11-08 DIAGNOSIS — F1011 Alcohol abuse, in remission: Secondary | ICD-10-CM

## 2013-11-08 DIAGNOSIS — F341 Dysthymic disorder: Secondary | ICD-10-CM

## 2013-11-08 DIAGNOSIS — Z23 Encounter for immunization: Secondary | ICD-10-CM

## 2013-11-08 DIAGNOSIS — F329 Major depressive disorder, single episode, unspecified: Secondary | ICD-10-CM

## 2013-11-08 NOTE — Progress Notes (Signed)
Pre-visit discussion using our clinic review tool. No additional management support is needed unless otherwise documented below in the visit note.  

## 2013-11-08 NOTE — Addendum Note (Signed)
Addended by: Sydell Axon C on: 11/08/2013 08:50 AM   Modules accepted: Orders

## 2013-11-08 NOTE — Assessment & Plan Note (Signed)
Pt seems motivated to comply with sobriety. I do recommend continued yearly evaluations for driving safety given recent EtOH use, discussed this with patient. Urine EtOH today.  Then will fax DMV form.

## 2013-11-08 NOTE — Progress Notes (Signed)
   Subjective:    Patient ID: Scott Fitzgerald, male    DOB: 11-Jan-1966, 47 y.o.   MRN: [id number]  HPI CC: discuss DMV paperwork  See prior note for details.  Presented last month discuss DMV forms he needs filled out - medical report. At first attributed no EtOH in 6 months, but when I asked him to undergo alcohol test, he admitted to continued use of EtOH.  Now states he has been abstinent from EtOH since 10/12/2013. S/p DWI remotely (2001), then again 09/2012. Went through [employer].   Had increased drinking prior. Currently with limited license - has completed 24 hours of community service. He joined support group at [employer].  Has been to 2 classes - has been to 2 AA meetings and brings in attendance record  Anxiety - improving.  Taking buspar 10mg  daily.  Rec drugs - denies.  Past Medical History  Diagnosis Date  . Alcohol abuse     s/p DWI x1, sober since 09/2009  . Anxiety     with panic disorder, previously on Seroquel, effexor, lexapro, zoloft, xanax, pristiq, celexa  . Emphysema     was on albuterol, no longer. never had PFT's. doesnt feel can currently afford meds/workup  . Asthma      Review of Systems Per HPI    Objective:   Physical Exam  Nursing note and vitals reviewed. Constitutional: He appears well-developed and well-nourished. No distress.  Psychiatric: He has a normal mood and affect.  Pleasant, good eye contact, calm and collected, good hygiene       Assessment & Plan:

## 2013-11-08 NOTE — Patient Instructions (Signed)
Flu shot today. Urine test today. We will fax DMV form this week. Good to see you, call us with questions.

## 2013-11-08 NOTE — Assessment & Plan Note (Signed)
Stable. Continue buspar.

## 2015-06-23 ENCOUNTER — Other Ambulatory Visit: Payer: Self-pay | Admitting: Family Medicine

## 2015-06-23 NOTE — Telephone Encounter (Signed)
Pt called to see why albuterol inhaler had not been called in yet. Pt having difficulty breathing but he is at work now and cannot leave until 5 PM; pt is presently in [place] but lives in [place]. Pt said for the past 2 nights from 3 Am - 7 AM had a lot of trouble getting his breath, not as bad now but knows it will get worse during the night tonight. Offered pt appt at [facility] and pt said no because he cannot leave work. Last seen 11/08/2013 and albuterol last refilled # 1 x 3 09/05/2011. Spoke with Dr Damita Dunnings and he advised pt needed to be seen today. Pt said when he left work he would go to [employer] in [place] and would cb soon and schedule appt with Dr Darnell Level.

## 2015-06-23 NOTE — Telephone Encounter (Signed)
Agreed.  I want him to go get checked out at the clinic as soon as possible, he may need neb tx there.  I didn't want him making a side trip to the pharmacy and delaying eval.  Thanks.

## 2015-09-14 ENCOUNTER — Telehealth: Payer: Self-pay | Admitting: Family Medicine

## 2015-09-14 ENCOUNTER — Ambulatory Visit (INDEPENDENT_AMBULATORY_CARE_PROVIDER_SITE_OTHER): Payer: Self-pay | Admitting: Family Medicine

## 2015-09-14 ENCOUNTER — Encounter: Payer: Self-pay | Admitting: Family Medicine

## 2015-09-14 VITALS — BP 100/64 | HR 62 | Temp 97.9°F | Ht 65.0 in | Wt 135.0 lb

## 2015-09-14 DIAGNOSIS — F17200 Nicotine dependence, unspecified, uncomplicated: Secondary | ICD-10-CM | POA: Insufficient documentation

## 2015-09-14 DIAGNOSIS — J439 Emphysema, unspecified: Secondary | ICD-10-CM

## 2015-09-14 DIAGNOSIS — F4010 Social phobia, unspecified: Secondary | ICD-10-CM

## 2015-09-14 DIAGNOSIS — H9319 Tinnitus, unspecified ear: Secondary | ICD-10-CM

## 2015-09-14 DIAGNOSIS — Z23 Encounter for immunization: Secondary | ICD-10-CM

## 2015-09-14 DIAGNOSIS — F1010 Alcohol abuse, uncomplicated: Secondary | ICD-10-CM

## 2015-09-14 DIAGNOSIS — F1011 Alcohol abuse, in remission: Secondary | ICD-10-CM

## 2015-09-14 DIAGNOSIS — Z720 Tobacco use: Secondary | ICD-10-CM

## 2015-09-14 HISTORY — DX: Tinnitus, unspecified ear: H93.19

## 2015-09-14 MED ORDER — ALBUTEROL SULFATE HFA 108 (90 BASE) MCG/ACT IN AERS: 2.0000 | INHALATION_SPRAY | Freq: Four times a day (QID) | RESPIRATORY_TRACT | Status: DC | PRN

## 2015-09-14 MED ORDER — VARENICLINE TARTRATE 0.5 MG X 11 & 1 MG X 42 PO MISC: ORAL | Status: DC

## 2015-09-14 MED ORDER — BUSPIRONE HCL 5 MG PO TABS: 5.0000 mg | ORAL_TABLET | Freq: Every day | ORAL | Status: DC

## 2015-09-14 MED ORDER — VARENICLINE TARTRATE 1 MG PO TABS: 1.0000 mg | ORAL_TABLET | Freq: Two times a day (BID) | ORAL | Status: DC

## 2015-09-14 NOTE — Patient Instructions (Addendum)
Flu shot today. Try buspar 5mg  daily for anxiety. We will fax DMV forms tomorrow.  Albuterol coupon provided today.  Try chantix - prescription printed out today. Look for coupon online. Urine test today.

## 2015-09-14 NOTE — Assessment & Plan Note (Signed)
Cannot afford spirometry, presumed dx. Refilled albuterol, proair coupon provided.

## 2015-09-14 NOTE — Assessment & Plan Note (Signed)
H/o DWI x2, endorses 1 mo abstinence from alcohol. Will check urine alcohol level and fill out DMV forms today. Advised he stay abstinent from alcohol. States if he does drink he only drinks on weekends, and never wth driving.

## 2015-09-14 NOTE — Assessment & Plan Note (Signed)
Longstanding, worsening. Pt will let us know if desires ENT referral.

## 2015-09-14 NOTE — Assessment & Plan Note (Addendum)
With h/o panic attacks. No recent panic attacks. Buspar 10mg  BID was not tolerated. Desires to restart. Will decrease buspar to 5mg  QD. Has tried several other meds with intolerances including trazodone, celexa.

## 2015-09-14 NOTE — Telephone Encounter (Signed)
Filled out DMV form and in Kim's box. Pt needs to come in to sign then plz fax form tomorrow.

## 2015-09-14 NOTE — Progress Notes (Signed)
BP 100/64 mmHg  Pulse 62  Temp(Src) 97.9 F (36.6 C) (Oral)  Ht 5\' 5"  (1.651 m)  Wt 135 lb (61.236 kg)  BMI 22.47 kg/m2  SpO2 99%   CC: discuss DMV paperwork  Subjective:    Patient ID: Scott Fitzgerald, male    DOB: 07-13-1966, 49 y.o.   MRN: [id number]  HPI: Ronny Korff is a 49 y.o. male presenting on 09/14/2015 for Annual Exam   Last seen here 10/2013. Presents to fill out DMV forms. H/o alcohol use. Denies any trouble with driving over last 2 years.   Business going well.   S/p DWI remotely (2001), then again 09/2012. Went through [employer] to [employer]. Praying.   Anxiety - stable off meds. Endorses some social anxiety. Prior buspar helped but then he thinks it caused GI upset with nausea. No trouble falling asleep.   Alcohol use - no alcohol in past month. Prior just drinking 4-5 beers on weekends never on work days or while driving, no liquor.  Smoking 1/2 ppd - interested in chantix.  Recreational drugs - denies.   Persistent tinnitus L>R. Intermittent noise exposure throughout his life. No hearing loss.   Relevant past medical, surgical, family and social history reviewed and updated as indicated. Interim medical history since our last visit reviewed. Allergies and medications reviewed and updated. Current Outpatient Prescriptions on File Prior to Visit  Medication Sig  . Fluticasone-Salmeterol (ADVAIR DISKUS) 100-50 MCG/DOSE AEPB Inhale 1 puff into the lungs every 12 (twelve) hours. Rinse after use    No current facility-administered medications on file prior to visit.    Review of Systems Per HPI unless specifically indicated above     Objective:    BP 100/64 mmHg  Pulse 62  Temp(Src) 97.9 F (36.6 C) (Oral)  Ht 5\' 5"  (1.651 m)  Wt 135 lb (61.236 kg)  BMI 22.47 kg/m2  SpO2 99%  Wt Readings from Last 3 Encounters:  09/14/15 135 lb (61.236 kg)  11/08/13 146 lb 4 oz (66.339 kg)  10/08/13 143 lb 8 oz (65.091 kg)    Physical Exam  Constitutional: He appears well-developed and well-nourished. No distress.  HENT:  Right Ear: Hearing, tympanic membrane, external ear and ear canal normal.  Left Ear: Hearing, tympanic membrane and ear canal normal.  Mouth/Throat: Oropharynx is clear and moist. No oropharyngeal exudate.  Chronic ext ear deformity  Eyes: Conjunctivae and EOM are normal. Pupils are equal, round, and reactive to light. No scleral icterus.  Neck: Normal range of motion. Neck supple.  Cardiovascular: Normal rate, regular rhythm, normal heart sounds and intact distal pulses.   No murmur heard. Pulmonary/Chest: Effort normal and breath sounds normal. No respiratory distress. He has no wheezes. He has no rales.  Musculoskeletal: He exhibits no edema.  Skin: Skin is warm and dry. No rash noted.  Psychiatric: He has a normal mood and affect.  Nursing note and vitals reviewed.  Results for orders placed or performed in visit on 11/08/13  Ethyl glucuronide, Urine  Result Value Ref Range   Ethyl Glucuronide (EtG) NEG Cutoff:500 ng/mL      Assessment & Plan:   Problem List Items Addressed This Visit    Tinnitus    Longstanding, worsening. Pt will let us know if desires ENT referral.      Social anxiety disorder - Primary    With h/o panic attacks. No recent panic attacks. Buspar 10mg  BID was not tolerated. Desires to restart. Will decrease buspar  to 5mg  QD. Has tried several other meds with intolerances including trazodone, celexa.      Smoker    Continue to encourage smoking cessation. Discussed chantix, Rx provided for pt to price out, rec he google "chantix coupon" to take to pharmacy.      History of alcohol abuse    H/o DWI x2, endorses 1 mo abstinence from alcohol. Will check urine alcohol level and fill out DMV forms today. Advised he stay abstinent from alcohol. States if he does drink he only drinks on weekends, and never wth driving.      Relevant Orders   Alcohol metabolite  (ETG), urine   Emphysema of lung ([place])    Cannot afford spirometry, presumed dx. Refilled albuterol, proair coupon provided.      Relevant Medications   varenicline (CHANTIX CONTINUING MONTH PAK) 1 MG tablet   varenicline (CHANTIX PAK) 0.5 MG X 11 & 1 MG X 42 tablet   albuterol (PROAIR HFA) 108 (90 BASE) MCG/ACT inhaler    Other Visit Diagnoses    Need for prophylactic vaccination and inoculation against influenza        Relevant Orders    Flu Vaccine QUAD 36+ mos IM (Completed)        Follow up plan: Return as needed, for annual exam, prior fasting for blood work.

## 2015-09-14 NOTE — Progress Notes (Signed)
Pre visit review using our clinic review tool, if applicable. No additional management support is needed unless otherwise documented below in the visit note. 

## 2015-09-14 NOTE — Assessment & Plan Note (Signed)
Continue to encourage smoking cessation. Discussed chantix, Rx provided for pt to price out, rec he google "chantix coupon" to take to pharmacy.

## 2015-09-15 LAB — ALCOHOL METABOLITE (ETG), URINE: ETGU: NEGATIVE ng/mL

## 2015-09-15 NOTE — Telephone Encounter (Signed)
Patient notified and form placed up front for completion.

## 2016-05-30 ENCOUNTER — Emergency Department (HOSPITAL_COMMUNITY): Payer: Self-pay

## 2016-05-30 ENCOUNTER — Encounter (HOSPITAL_COMMUNITY): Payer: Self-pay | Admitting: Emergency Medicine

## 2016-05-30 ENCOUNTER — Emergency Department (HOSPITAL_COMMUNITY)
Admission: EM | Admit: 2016-05-30 | Discharge: 2016-05-30 | Disposition: A | Discharge status: Home / Self Care | Payer: Self-pay | Attending: Emergency Medicine | Admitting: Emergency Medicine

## 2016-05-30 DIAGNOSIS — T887XXA Unspecified adverse effect of drug or medicament, initial encounter: Secondary | ICD-10-CM | POA: Insufficient documentation

## 2016-05-30 DIAGNOSIS — Y829 Unspecified medical devices associated with adverse incidents: Secondary | ICD-10-CM | POA: Insufficient documentation

## 2016-05-30 DIAGNOSIS — T50905A Adverse effect of unspecified drugs, medicaments and biological substances, initial encounter: Secondary | ICD-10-CM | POA: Insufficient documentation

## 2016-05-30 DIAGNOSIS — Z79899 Other long term (current) drug therapy: Secondary | ICD-10-CM | POA: Insufficient documentation

## 2016-05-30 DIAGNOSIS — F17210 Nicotine dependence, cigarettes, uncomplicated: Secondary | ICD-10-CM | POA: Insufficient documentation

## 2016-05-30 DIAGNOSIS — J45901 Unspecified asthma with (acute) exacerbation: Secondary | ICD-10-CM | POA: Insufficient documentation

## 2016-05-30 DIAGNOSIS — R42 Dizziness and giddiness: Secondary | ICD-10-CM | POA: Insufficient documentation

## 2016-05-30 LAB — COMPREHENSIVE METABOLIC PANEL
ALBUMIN: 4.3 g/dL (ref 3.5–5.0)
ALK PHOS: 81 U/L (ref 38–126)
ALT: 24 U/L (ref 17–63)
AST: 37 U/L (ref 15–41)
Anion gap: 11 (ref 5–15)
BILIRUBIN TOTAL: 0.6 mg/dL (ref 0.3–1.2)
BUN: 6 mg/dL (ref 6–20)
CO2: 21 mmol/L — AB (ref 22–32)
Calcium: 9.3 mg/dL (ref 8.9–10.3)
Chloride: 106 mmol/L (ref 101–111)
Creatinine, Ser: 0.89 mg/dL (ref 0.61–1.24)
GFR calc Af Amer: >60 mL/min (ref >60)
GFR calc non Af Amer: >60 mL/min (ref >60)
GLUCOSE: 113 mg/dL — AB (ref 65–99)
POTASSIUM: 3.4 mmol/L — AB (ref 3.5–5.1)
SODIUM: 138 mmol/L (ref 135–145)
TOTAL PROTEIN: 7.3 g/dL (ref 6.5–8.1)

## 2016-05-30 LAB — CBC WITH DIFFERENTIAL/PLATELET
BASOS ABS: 0 10*3/uL (ref 0.0–0.1)
BASOS PCT: 0 %
EOS ABS: 0.1 10*3/uL (ref 0.0–0.7)
Eosinophils Relative: 1 %
HEMATOCRIT: 45.6 % (ref 39.0–52.0)
HEMOGLOBIN: 15.8 g/dL (ref 13.0–17.0)
Lymphocytes Relative: 28 %
Lymphs Abs: 1.8 10*3/uL (ref 0.7–4.0)
MCH: 33.3 pg (ref 26.0–34.0)
MCHC: 34.6 g/dL (ref 30.0–36.0)
MCV: 96.2 fL (ref 78.0–100.0)
Monocytes Absolute: 0.5 10*3/uL (ref 0.1–1.0)
Monocytes Relative: 7 %
NEUTROS ABS: 4 10*3/uL (ref 1.7–7.7)
NEUTROS PCT: 64 %
Platelets: 265 10*3/uL (ref 150–400)
RBC: 4.74 MIL/uL (ref 4.22–5.81)
RDW: 12.9 % (ref 11.5–15.5)
WBC: 6.4 10*3/uL (ref 4.0–10.5)

## 2016-05-30 LAB — ETHANOL: Alcohol, Ethyl (B): <5 mg/dL (ref <5)

## 2016-05-30 LAB — RAPID URINE DRUG SCREEN, HOSP PERFORMED
Amphetamines: NOT DETECTED
BARBITURATES: NOT DETECTED
BENZODIAZEPINES: NOT DETECTED
COCAINE: NOT DETECTED
Opiates: NOT DETECTED
TETRAHYDROCANNABINOL: NOT DETECTED

## 2016-05-30 MED ORDER — PREDNISONE 20 MG PO TABS: ORAL_TABLET | ORAL | Status: DC

## 2016-05-30 MED ORDER — SODIUM CHLORIDE 0.9 % IV BOLUS (SEPSIS)
1000.0000 mL | Freq: Once | INTRAVENOUS | Status: AC
  Administered 2016-05-30: 1000 mL via INTRAVENOUS

## 2016-05-30 MED ORDER — METHYLPREDNISOLONE SODIUM SUCC 125 MG IJ SOLR
125.0000 mg | Freq: Once | INTRAMUSCULAR | Status: AC
Start: 2016-05-30 — End: 2016-05-30
  Administered 2016-05-30: 125 mg via INTRAVENOUS
  Filled 2016-05-30: qty 2

## 2016-05-30 MED ORDER — LEVALBUTEROL HCL 1.25 MG/0.5ML IN NEBU
1.2500 mg | INHALATION_SOLUTION | Freq: Once | RESPIRATORY_TRACT | Status: AC
  Administered 2016-05-30: 1.25 mg via RESPIRATORY_TRACT
  Filled 2016-05-30: qty 0.5

## 2016-05-30 MED ORDER — LORAZEPAM 2 MG/ML IJ SOLN
0.5000 mg | Freq: Once | INTRAMUSCULAR | Status: AC
  Administered 2016-05-30: 0.5 mg via INTRAVENOUS
  Filled 2016-05-30: qty 1

## 2016-05-30 NOTE — Discharge Instructions (Signed)
Asthma, Adult Asthma is a condition of the lungs in which the airways tighten and narrow. Asthma can make it hard to breathe. Asthma cannot be cured, but medicine and lifestyle changes can help control it. Asthma may be started (triggered) by:  Animal skin flakes (dander).  Dust.  Cockroaches.  Pollen.  Mold.  Smoke.  Cleaning products.  Hair sprays or aerosol sprays.  Paint fumes or strong smells.  Cold air, weather changes, and winds.  Crying or laughing hard.  Stress.  Certain medicines or drugs.  Foods, such as dried fruit, potato chips, and sparkling grape juice.  Infections or conditions (colds, flu).  Exercise.  Certain medical conditions or diseases.  Exercise or tiring activities. HOME CARE   Take medicine as told by your doctor.  Use a peak flow meter as told by your doctor. A peak flow meter is a tool that measures how well the lungs are working.  Record and keep track of the peak flow meter's readings.  Understand and use the asthma action plan. An asthma action plan is a written plan for taking care of your asthma and treating your attacks.  To help prevent asthma attacks:  Do not smoke. Stay away from secondhand smoke.  Change your heating and air conditioning filter often.  Limit your use of fireplaces and wood stoves.  Get rid of pests (such as roaches and mice) and their droppings.  Throw away plants if you see mold on them.  Clean your floors. Dust regularly. Use cleaning products that do not smell.  Have someone vacuum when you are not home. Use a vacuum cleaner with a HEPA filter if possible.  Replace carpet with wood, tile, or vinyl flooring. Carpet can trap animal skin flakes and dust.  Use allergy-proof pillows, mattress covers, and box spring covers.  Wash bed sheets and blankets every week in hot water and dry them in a dryer.  Use blankets that are made of polyester or cotton.  Clean bathrooms and kitchens with bleach.  If possible, have someone repaint the walls in these rooms with mold-resistant paint. Keep out of the rooms that are being cleaned and painted.  Wash hands often. GET HELP IF:  You have make a whistling sound when breaking (wheeze), have shortness of breath, or have a cough even if taking medicine to prevent attacks.  The colored mucus you cough up (sputum) is thicker than usual.  The colored mucus you cough up changes from clear or white to yellow, green, gray, or bloody.  You have problems from the medicine you are taking such as:  A rash.  Itching.  Swelling.  Trouble breathing.  You need reliever medicines more than 2-3 times a week.  Your peak flow measurement is still at 50-79% of your personal best after following the action plan for 1 hour.  You have a fever. GET HELP RIGHT AWAY IF:   You seem to be worse and are not responding to medicine during an asthma attack.  You are short of breath even at rest.  You get short of breath when doing very little activity.  You have trouble eating, drinking, or talking.  You have chest pain.  You have a fast heartbeat.  Your lips or fingernails start to turn blue.  You are light-headed, dizzy, or faint.  Your peak flow is less than 50% of your personal best.   This information is not intended to replace advice given to you by your health care provider. Make sure  you discuss any questions you have with your health care provider.   Document Released: 04/29/2008 Document Revised: 08/02/2015 Document Reviewed: 06/10/2013 Elsevier Interactive Patient Education 2016 [employer].  Drug Toxicity Drug toxicity refers to harmful and unwanted (adverse) effects of a drug in your body. Drug toxicity often results from taking too much of a drug (overdose) by accident or on purpose. With some drugs, there is only a small difference between the dose that is needed to treat your condition and a dose that is harmful (narrow therapeutic  range). However, any drug can be toxic at high doses, and even normal doses of certain drugs can be toxic for some people. These include over-the-counter (OTC) medicines. Drug toxicity can happen suddenly when you first start taking a drug or when you suddenly take too much of a drug (acute toxicity). It can also happen as a result of taking a drug for a long period of time (chronic toxicity). The effects of drug toxicity can be mild, dangerous, or even deadly. CAUSES Many things can cause drug toxicity. Common causes of acute toxicity include a drug overdose or an allergic reaction to a drug. Most drugs are broken down (metabolized) by your liver and eliminated (excreted) by your kidneys. Chronic drug toxicity can result from changes in the way that your body metabolizes a drug. This can happen, for example, if you weigh less than you did when you started taking a drug but you keep taking the same dose that you took at the heavier weight. RISK FACTORS You may have a higher risk for drug toxicity if you:  Are under 69 years of age or over 24 years of age.  Have liver disease, kidney disease, or another medical condition.  Are taking more than one drug.  Are pregnant.  Are allergic to certain drugs.  Have genes that cause you to be more affected by (susceptible to) certain drugs.  Take a drug that has a narrow therapeutic range. Certain types of drugs are more likely than others to cause toxicity. Many drugs have a narrow therapeutic range, including:  Blood thinners.  Heart medicines.  Diabetes medicines.  Medicines to prevent or stop seizures.  Theophylline for asthma.  Lithium for bipolar disorder. SYMPTOMS Signs and symptoms of drug toxicity depend on the drug and the amount that was taken. They may start suddenly or develop gradually over time. DIAGNOSIS Drug toxicity may be diagnosed based on your symptoms. Some drugs have known side effects that suggest toxicity. It is  important that you tell health care provider about all of the drugs that you are taking and whether you have ever had a reaction to a drug. Your health care provider will do a physical exam. You may have tests to check for drug toxicity, including:  Blood tests to measure the amount of the drug in your blood or to check for signs of kidney or liver damage.  Urine tests.  Other tests to check for organ damage. TREATMENT Treatment may include:  Stopping the drug.  Lowering the dose of the drug.  Switching to a different drug. You may also need treatment to stop or reverse the effects of the toxicity. These treatments depend on the drug that caused the toxicity, how severe the toxicity is, and which parts of your body are affected. HOME CARE INSTRUCTIONS  Take medicines only as directed by your health care provider. Always ask your health care provider to discuss the possible side effects of any new drug that  you start taking.  Keep a list of all of the drugs that you take, including over-the-counter medicines. Bring this list with you to all of your medical visits.  Read the drug inserts that come with your medicines.  Keep all follow-up visits as directed by your health care provider. This is important. SEEK MEDICAL CARE IF:  Your symptoms return.  You develop any new signs or symptoms when you are taking medicines.  You notice any signs that indicate that you are taking too much of your medicine, based on what your health care provider told you to watch for. SEEK IMMEDIATE MEDICAL CARE IF:  You have chest pain.  You have difficulty breathing.  You have a loss of consciousness.   This information is not intended to replace advice given to you by your health care provider. Make sure you discuss any questions you have with your health care provider.   Document Released: 11/11/2005 Document Revised: 03/28/2015 Document Reviewed: 11/16/2014 Elsevier Interactive Patient  Education [employer].

## 2016-05-30 NOTE — ED Notes (Signed)
Pt reports SOB that started this am. Pt c/o shakiness, reports he used his inhaler 5-6 times since approx 1100. Pt denies nausea, but reports lightheadedness. Pt denies chest pain.
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
## 2017-06-13 ENCOUNTER — Other Ambulatory Visit: Payer: Self-pay | Admitting: Family Medicine

## 2018-06-12 ENCOUNTER — Other Ambulatory Visit: Payer: Self-pay

## 2018-06-12 ENCOUNTER — Emergency Department: Payer: Self-pay

## 2018-06-12 ENCOUNTER — Emergency Department
Admission: EM | Admit: 2018-06-12 | Discharge: 2018-06-12 | Disposition: A | Discharge status: Home / Self Care | Payer: Self-pay | Attending: Emergency Medicine | Admitting: Emergency Medicine

## 2018-06-12 DIAGNOSIS — J441 Chronic obstructive pulmonary disease with (acute) exacerbation: Secondary | ICD-10-CM | POA: Insufficient documentation

## 2018-06-12 DIAGNOSIS — R531 Weakness: Secondary | ICD-10-CM

## 2018-06-12 DIAGNOSIS — F17210 Nicotine dependence, cigarettes, uncomplicated: Secondary | ICD-10-CM | POA: Insufficient documentation

## 2018-06-12 DIAGNOSIS — N3001 Acute cystitis with hematuria: Secondary | ICD-10-CM | POA: Insufficient documentation

## 2018-06-12 LAB — BASIC METABOLIC PANEL
Anion gap: 10 (ref 5–15)
BUN: 9 mg/dL (ref 6–20)
CALCIUM: 9.7 mg/dL (ref 8.9–10.3)
CHLORIDE: 103 mmol/L (ref 98–111)
CO2: 28 mmol/L (ref 22–32)
CREATININE: 0.7 mg/dL (ref 0.61–1.24)
GFR calc non Af Amer: >60 mL/min (ref >60)
Glucose, Bld: 116 mg/dL — ABNORMAL HIGH (ref 70–99)
Potassium: 4.1 mmol/L (ref 3.5–5.1)
Sodium: 141 mmol/L (ref 135–145)

## 2018-06-12 LAB — URINALYSIS, COMPLETE (UACMP) WITH MICROSCOPIC
BILIRUBIN URINE: NEGATIVE
Glucose, UA: NEGATIVE mg/dL
Hgb urine dipstick: NEGATIVE
Ketones, ur: 20 mg/dL — AB
Nitrite: POSITIVE — AB
PH: 6 (ref 5.0–8.0)
Protein, ur: 30 mg/dL — AB
SPECIFIC GRAVITY, URINE: 1.021 (ref 1.005–1.030)
WBC, UA: >50 WBC/hpf — ABNORMAL HIGH (ref 0–5)

## 2018-06-12 LAB — CBC
HCT: 48.4 % (ref 40.0–52.0)
Hemoglobin: 16.8 g/dL (ref 13.0–18.0)
MCH: 36.2 pg — AB (ref 26.0–34.0)
MCHC: 34.8 g/dL (ref 32.0–36.0)
MCV: 103.9 fL — AB (ref 80.0–100.0)
PLATELETS: 308 10*3/uL (ref 150–440)
RBC: 4.66 MIL/uL (ref 4.40–5.90)
RDW: 14 % (ref 11.5–14.5)
WBC: 11.9 10*3/uL — ABNORMAL HIGH (ref 3.8–10.6)

## 2018-06-12 LAB — GLUCOSE, CAPILLARY: GLUCOSE-CAPILLARY: 112 mg/dL — AB (ref 70–99)

## 2018-06-12 LAB — HEPATIC FUNCTION PANEL
ALK PHOS: 54 U/L (ref 38–126)
ALT: 26 U/L (ref 0–44)
AST: 41 U/L (ref 15–41)
Albumin: 4.3 g/dL (ref 3.5–5.0)
BILIRUBIN INDIRECT: 0.8 mg/dL (ref 0.3–0.9)
Bilirubin, Direct: 0.2 mg/dL (ref 0.0–0.2)
TOTAL PROTEIN: 7.3 g/dL (ref 6.5–8.1)
Total Bilirubin: 1 mg/dL (ref 0.3–1.2)

## 2018-06-12 LAB — ETHANOL

## 2018-06-12 MED ORDER — IPRATROPIUM-ALBUTEROL 0.5-2.5 (3) MG/3ML IN SOLN
3.0000 mL | Freq: Once | RESPIRATORY_TRACT | Status: AC
  Administered 2018-06-12: 3 mL via RESPIRATORY_TRACT
  Filled 2018-06-12: qty 3

## 2018-06-12 MED ORDER — LORAZEPAM 2 MG/ML IJ SOLN
1.0000 mg | Freq: Once | INTRAMUSCULAR | Status: AC
  Administered 2018-06-12: 1 mg via INTRAVENOUS
  Filled 2018-06-12: qty 1

## 2018-06-12 MED ORDER — ALBUTEROL SULFATE HFA 108 (90 BASE) MCG/ACT IN AERS: 2.0000 | INHALATION_SPRAY | Freq: Four times a day (QID) | RESPIRATORY_TRACT | 2 refills | Status: DC | PRN

## 2018-06-12 MED ORDER — CEPHALEXIN 500 MG PO CAPS: 500.0000 mg | ORAL_CAPSULE | Freq: Three times a day (TID) | ORAL | 0 refills | Status: AC

## 2018-06-12 MED ORDER — THIAMINE HCL 100 MG/ML IJ SOLN
100.0000 mg | Freq: Once | INTRAMUSCULAR | Status: AC
  Administered 2018-06-12: 100 mg via INTRAVENOUS
  Filled 2018-06-12: qty 2

## 2018-06-12 MED ORDER — ONDANSETRON HCL 4 MG/2ML IJ SOLN
4.0000 mg | Freq: Once | INTRAMUSCULAR | Status: AC
  Administered 2018-06-12: 4 mg via INTRAVENOUS
  Filled 2018-06-12: qty 2

## 2018-06-12 MED ORDER — SODIUM CHLORIDE 0.9 % IV BOLUS
1000.0000 mL | Freq: Once | INTRAVENOUS | Status: AC
  Administered 2018-06-12: 1000 mL via INTRAVENOUS

## 2018-06-12 MED ORDER — PREDNISONE 20 MG PO TABS
60.0000 mg | ORAL_TABLET | Freq: Once | ORAL | Status: AC
  Administered 2018-06-12: 60 mg via ORAL
  Filled 2018-06-12: qty 3

## 2018-06-12 MED ORDER — SODIUM CHLORIDE 0.9 % IV SOLN
1.0000 g | Freq: Once | INTRAVENOUS | Status: AC
  Administered 2018-06-12: 1 g via INTRAVENOUS
  Filled 2018-06-12: qty 10

## 2018-06-12 MED ORDER — PREDNISONE 20 MG PO TABS: 60.0000 mg | ORAL_TABLET | Freq: Every day | ORAL | 0 refills | Status: AC

## 2018-06-12 MED ORDER — IPRATROPIUM-ALBUTEROL 0.5-2.5 (3) MG/3ML IN SOLN
3.0000 mL | Freq: Once | RESPIRATORY_TRACT | Status: AC
Start: 2018-06-12 — End: 2018-06-12
  Administered 2018-06-12: 3 mL via RESPIRATORY_TRACT
  Filled 2018-06-12: qty 3
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
## 2018-06-12 NOTE — ED Triage Notes (Signed)
Generalized weakness X 3 days. Loss of appetite. Pt reports that he is anxious, has been dx with anxiety. Pt alert and oriented X4, active, cooperative, pt in NAD. RR even and unlabored, color WNL.

## 2018-06-12 NOTE — ED Notes (Signed)
ED Provider at bedside. 

## 2018-06-16 LAB — URINE CULTURE: Culture: >=100000 — AB

## 2020-10-16 ENCOUNTER — Other Ambulatory Visit: Payer: Self-pay

## 2020-10-16 ENCOUNTER — Emergency Department (HOSPITAL_COMMUNITY): Payer: Self-pay

## 2020-10-16 ENCOUNTER — Emergency Department (HOSPITAL_COMMUNITY)
Admission: EM | Admit: 2020-10-16 | Discharge: 2020-10-16 | Disposition: A | Discharge status: Home / Self Care | Payer: Self-pay | Attending: Emergency Medicine | Admitting: Emergency Medicine

## 2020-10-16 ENCOUNTER — Encounter (HOSPITAL_COMMUNITY): Payer: Self-pay | Admitting: *Deleted

## 2020-10-16 DIAGNOSIS — R200 Anesthesia of skin: Secondary | ICD-10-CM | POA: Insufficient documentation

## 2020-10-16 DIAGNOSIS — J439 Emphysema, unspecified: Secondary | ICD-10-CM | POA: Diagnosis present

## 2020-10-16 DIAGNOSIS — R531 Weakness: Secondary | ICD-10-CM | POA: Insufficient documentation

## 2020-10-16 DIAGNOSIS — R062 Wheezing: Secondary | ICD-10-CM | POA: Insufficient documentation

## 2020-10-16 DIAGNOSIS — E876 Hypokalemia: Secondary | ICD-10-CM | POA: Diagnosis present

## 2020-10-16 DIAGNOSIS — N3080 Other cystitis without hematuria: Secondary | ICD-10-CM | POA: Insufficient documentation

## 2020-10-16 DIAGNOSIS — R110 Nausea: Secondary | ICD-10-CM | POA: Insufficient documentation

## 2020-10-16 DIAGNOSIS — N3000 Acute cystitis without hematuria: Secondary | ICD-10-CM

## 2020-10-16 DIAGNOSIS — F17210 Nicotine dependence, cigarettes, uncomplicated: Secondary | ICD-10-CM | POA: Insufficient documentation

## 2020-10-16 DIAGNOSIS — F17200 Nicotine dependence, unspecified, uncomplicated: Secondary | ICD-10-CM | POA: Diagnosis present

## 2020-10-16 DIAGNOSIS — Z20822 Contact with and (suspected) exposure to covid-19: Secondary | ICD-10-CM | POA: Insufficient documentation

## 2020-10-16 DIAGNOSIS — F1011 Alcohol abuse, in remission: Secondary | ICD-10-CM | POA: Diagnosis present

## 2020-10-16 LAB — URINALYSIS, ROUTINE W REFLEX MICROSCOPIC
Bilirubin Urine: NEGATIVE
Glucose, UA: NEGATIVE mg/dL
Ketones, ur: NEGATIVE mg/dL
Nitrite: POSITIVE — AB
Protein, ur: NEGATIVE mg/dL
Specific Gravity, Urine: 1.004 — ABNORMAL LOW (ref 1.005–1.030)
WBC, UA: >50 WBC/hpf — ABNORMAL HIGH (ref 0–5)
pH: 7 (ref 5.0–8.0)

## 2020-10-16 LAB — RESP PANEL BY RT-PCR (FLU A&B, COVID) ARPGX2
Influenza A by PCR: NEGATIVE
Influenza B by PCR: NEGATIVE
SARS Coronavirus 2 by RT PCR: NEGATIVE

## 2020-10-16 LAB — CBC WITH DIFFERENTIAL/PLATELET
Abs Immature Granulocytes: 0.02 10*3/uL (ref 0.00–0.07)
Basophils Absolute: 0.1 10*3/uL (ref 0.0–0.1)
Basophils Relative: 1 %
Eosinophils Absolute: 0.1 10*3/uL (ref 0.0–0.5)
Eosinophils Relative: 1 %
HCT: 49.7 % (ref 39.0–52.0)
Hemoglobin: 17.6 g/dL — ABNORMAL HIGH (ref 13.0–17.0)
Immature Granulocytes: 0 %
Lymphocytes Relative: 24 %
Lymphs Abs: 2.3 10*3/uL (ref 0.7–4.0)
MCH: 38 pg — ABNORMAL HIGH (ref 26.0–34.0)
MCHC: 35.4 g/dL (ref 30.0–36.0)
MCV: 107.3 fL — ABNORMAL HIGH (ref 80.0–100.0)
Monocytes Absolute: 0.8 10*3/uL (ref 0.1–1.0)
Monocytes Relative: 8 %
Neutro Abs: 6.5 10*3/uL (ref 1.7–7.7)
Neutrophils Relative %: 66 %
Platelets: 334 10*3/uL (ref 150–400)
RBC: 4.63 MIL/uL (ref 4.22–5.81)
RDW: 13.3 % (ref 11.5–15.5)
WBC: 9.7 10*3/uL (ref 4.0–10.5)
nRBC: 0 % (ref 0.0–0.2)

## 2020-10-16 LAB — LIPASE, BLOOD: Lipase: 20 U/L (ref 11–51)

## 2020-10-16 LAB — MAGNESIUM: Magnesium: 1.8 mg/dL (ref 1.7–2.4)

## 2020-10-16 LAB — COMPREHENSIVE METABOLIC PANEL
ALT: 39 U/L (ref 0–44)
AST: 60 U/L — ABNORMAL HIGH (ref 15–41)
Albumin: 3.3 g/dL — ABNORMAL LOW (ref 3.5–5.0)
Alkaline Phosphatase: 79 U/L (ref 38–126)
Anion gap: 15 (ref 5–15)
BUN: 7 mg/dL (ref 6–20)
CO2: 30 mmol/L (ref 22–32)
Calcium: 9.7 mg/dL (ref 8.9–10.3)
Chloride: 93 mmol/L — ABNORMAL LOW (ref 98–111)
Creatinine, Ser: 0.68 mg/dL (ref 0.61–1.24)
GFR, Estimated: >60 mL/min (ref >60)
Glucose, Bld: 157 mg/dL — ABNORMAL HIGH (ref 70–99)
Potassium: 2.5 mmol/L — CL (ref 3.5–5.1)
Sodium: 138 mmol/L (ref 135–145)
Total Bilirubin: 1.1 mg/dL (ref 0.3–1.2)
Total Protein: 6.5 g/dL (ref 6.5–8.1)

## 2020-10-16 LAB — TROPONIN I (HIGH SENSITIVITY): Troponin I (High Sensitivity): 3 ng/L (ref <18)

## 2020-10-16 MED ORDER — IPRATROPIUM-ALBUTEROL 0.5-2.5 (3) MG/3ML IN SOLN
3.0000 mL | Freq: Once | RESPIRATORY_TRACT | Status: AC
  Administered 2020-10-16: 3 mL via RESPIRATORY_TRACT
  Filled 2020-10-16: qty 3

## 2020-10-16 MED ORDER — IPRATROPIUM-ALBUTEROL 20-100 MCG/ACT IN AERS
1.0000 | INHALATION_SPRAY | Freq: Four times a day (QID) | RESPIRATORY_TRACT | Status: DC
  Filled 2020-10-16: qty 4

## 2020-10-16 MED ORDER — ALBUTEROL SULFATE HFA 108 (90 BASE) MCG/ACT IN AERS
1.0000 | INHALATION_SPRAY | Freq: Once | RESPIRATORY_TRACT | Status: DC
  Filled 2020-10-16: qty 6.7

## 2020-10-16 MED ORDER — ALBUTEROL SULFATE (2.5 MG/3ML) 0.083% IN NEBU: 2.5000 mg | INHALATION_SOLUTION | Freq: Once | RESPIRATORY_TRACT | Status: AC

## 2020-10-16 MED ORDER — POTASSIUM CHLORIDE ER 10 MEQ PO TBCR: 10.0000 meq | EXTENDED_RELEASE_TABLET | Freq: Every day | ORAL | 0 refills | Status: DC

## 2020-10-16 MED ORDER — POTASSIUM CHLORIDE CRYS ER 20 MEQ PO TBCR
20.0000 meq | EXTENDED_RELEASE_TABLET | Freq: Once | ORAL | Status: AC
  Administered 2020-10-16: 20 meq via ORAL
  Filled 2020-10-16: qty 1

## 2020-10-16 MED ORDER — ALBUTEROL SULFATE HFA 108 (90 BASE) MCG/ACT IN AERS: 2.0000 | INHALATION_SPRAY | Freq: Once | RESPIRATORY_TRACT | Status: DC

## 2020-10-16 MED ORDER — ALBUTEROL SULFATE (2.5 MG/3ML) 0.083% IN NEBU
INHALATION_SOLUTION | RESPIRATORY_TRACT | Status: AC
  Administered 2020-10-16: 2.5 mg via RESPIRATORY_TRACT
  Filled 2020-10-16: qty 3

## 2020-10-16 MED ORDER — SODIUM CHLORIDE 0.9 % IV BOLUS
500.0000 mL | Freq: Once | INTRAVENOUS | Status: AC
  Administered 2020-10-16: 500 mL via INTRAVENOUS

## 2020-10-16 MED ORDER — THIAMINE HCL 100 MG/ML IJ SOLN
100.0000 mg | Freq: Once | INTRAMUSCULAR | Status: AC
  Administered 2020-10-16: 100 mg via INTRAVENOUS
  Filled 2020-10-16: qty 2

## 2020-10-16 MED ORDER — POTASSIUM CHLORIDE 10 MEQ/100ML IV SOLN
10.0000 meq | INTRAVENOUS | Status: AC
  Administered 2020-10-16 (×3): 10 meq via INTRAVENOUS
  Filled 2020-10-16 (×3): qty 100
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
## 2020-10-16 NOTE — ED Triage Notes (Signed)
Pt c/o feeling like his hands, feet and mouth are numb since Friday. Pt also c/o nausea. Pt c/o generalized weakness, but denies unilateral weakness.

## 2020-10-16 NOTE — ED Notes (Signed)
CRITICAL VALUE ALERT  Critical Value:  Kt 2.5  Date & Time Notied:  10/16/20  1406  Provider Notified: Delmer Islam  Orders Received/Actions taken:

## 2020-10-16 NOTE — Discharge Instructions (Signed)
Seen here for weakness and numbness and tingling.  Lab work showed you have low potassium.  I have replenished your potassium and written you a prescription for more,  please take as prescribed.  It is important that you follow-up with your primary care provider I have given you the contact information for one that works with individuals who have little to no health insurance.  Come back to the emergency department if you develop chest pain, shortness of breath, severe abdominal pain, uncontrolled nausea, vomiting, diarrhea.

## 2020-10-16 NOTE — Consult Note (Addendum)
CONSULT NOTE   Gerrit Rafalski [facility]  NAT:[id number] DOB: 12-24-1965 DOA: 10/16/2020 PCP: Patient, No Pcp Per   Chief Complaint  Patient presents with  . Numbness    Brief History:  54 y/o male with history of chronic alcoholism, COPD, tobacco presented to ED complaining of paresthesia involving the hand, feet, mouth associated with nausea and generalized weakness, poor oral intake over past couple of days.  ED work up mostly unrevealing with the exception of potassium of 2.5.    Assessment & Plan:   Active Problems:   History of alcohol abuse   Emphysema of lung (H[facility])   Smoker   Hypokalemia  1. Hypokalemia - from poor oral intake, no acute changes on EKG. IV replacement being given in ED.  Magnesium within normal limits.  Pt does not need hospitalization to treat this effectively.  I recommend completing the 3 runs of K that have been ordered in ED. Recheck K and if coming up can safely discharge home. Can give Rx for oral potassium at discharge.  Close outpatient follow up with PCP recommended for repeat BMP testing.  I gave my recommendations to W. Ileene Patrick who verbalized understanding.  If any concerns come up feel free to call back to [facility]. I would also ask social worker to consult with patient to offer alcohol abuse treatment options.  They can see him in the ED.   Objective: Vitals:   10/16/20 1330 10/16/20 1400 10/16/20 1430 10/16/20 1435  BP: 122/79 125/70 (!) 114/91   Pulse: 81 73 69   Resp: 16 20 (!) 26   Temp:      TempSrc:      SpO2: 98% 100% 93% 99%  Weight:      Height:       No intake or output data in the 24 hours ending 10/16/20 1459 Filed Weights   10/16/20 1220  Weight: 56.7 kg    Data Reviewed: I have personally reviewed following labs and imaging studies  CBC: Recent Labs  Lab 10/16/20 1251  WBC 9.7  NEUTROABS 6.5  HGB 17.6*  HCT 49.7  MCV 107.3*  PLT 427    Basic Metabolic Panel: Recent Labs  Lab 10/16/20 1251  NA 138  K  2.5*  CL 93*  CO2 30  GLUCOSE 157*  BUN 7  CREATININE 0.68  CALCIUM 9.7  MG 1.8    GFR: Estimated Creatinine Clearance: 83.7 mL/min (by C-G formula based on SCr of 0.68 mg/dL).  Liver Function Tests: Recent Labs  Lab 10/16/20 1251  AST 60*  ALT 39  ALKPHOS 79  BILITOT 1.1  PROT 6.5  ALBUMIN 3.3*    CBG: No results for input(s): GLUCAP in the last 168 hours.  Recent Results (from the past 240 hour(s))  Resp Panel by RT-PCR (Flu A&B, Covid) Nasopharyngeal Swab     Status: None   Collection Time: 10/16/20 12:50 PM   Specimen: Nasopharyngeal Swab; Nasopharyngeal(NP) swabs in vial transport medium  Result Value Ref Range Status   SARS Coronavirus 2 by RT PCR NEGATIVE NEGATIVE Final    Comment: (NOTE) SARS-CoV-2 target nucleic acids are NOT DETECTED.  The SARS-CoV-2 RNA is generally detectable in upper respiratory specimens during the acute phase of infection. The lowest concentration of SARS-CoV-2 viral copies this assay can detect is 138 copies/mL. A negative result does not preclude SARS-Cov-2 infection and should not be used as the sole basis for treatment or other patient management decisions. A negative result may occur with  improper specimen collection/handling, submission of specimen other than nasopharyngeal swab, presence of viral mutation(s) within the areas targeted by this assay, and inadequate number of viral copies(<138 copies/mL). A negative result must be combined with clinical observations, patient history, and epidemiological information. The expected result is Negative.  Fact Sheet for Patients:  EntrepreneurPulse.com.au  Fact Sheet for Healthcare Providers:  IncredibleEmployment.be  This test is no t yet approved or cleared by the [place] FDA and  has been authorized for detection and/or diagnosis of SARS-CoV-2 by FDA under an Emergency Use Authorization (EUA). This EUA will remain  in effect (meaning  this test can be used) for the duration of the COVID-19 declaration under Section 564(b)(1) of the Act, 21 U.S.C.section 360bbb-3(b)(1), unless the authorization is terminated  or revoked sooner.       Influenza A by PCR NEGATIVE NEGATIVE Final   Influenza B by PCR NEGATIVE NEGATIVE Final    Comment: (NOTE) The Xpert Xpress SARS-CoV-2/FLU/RSV plus assay is intended as an aid in the diagnosis of influenza from Nasopharyngeal swab specimens and should not be used as a sole basis for treatment. Nasal washings and aspirates are unacceptable for Xpert Xpress SARS-CoV-2/FLU/RSV testing.  Fact Sheet for Patients: EntrepreneurPulse.com.au  Fact Sheet for Healthcare Providers: IncredibleEmployment.be  This test is not yet approved or cleared by the [place] FDA and has been authorized for detection and/or diagnosis of SARS-CoV-2 by FDA under an Emergency Use Authorization (EUA). This EUA will remain in effect (meaning this test can be used) for the duration of the COVID-19 declaration under Section 564(b)(1) of the Act, 21 U.S.C. section 360bbb-3(b)(1), unless the authorization is terminated or revoked.  Performed at [facility], [address] [place], [place] [address]      Radiology Studies: DG Chest 1 View  Result Date: 10/16/2020 CLINICAL DATA:  Cough.  Generalized weakness. EXAM: CHEST  1 VIEW COMPARISON:  06/12/2018 FINDINGS: Telemetry leads overlie the chest. The cardiomediastinal silhouette is unchanged with normal heart size. The lungs remain hyperinflated. No airspace consolidation, edema, pleural effusion, pneumothorax is identified. No acute osseous abnormality is seen. IMPRESSION: No active disease. Electronically Signed   By: Logan Bores M.D.   On: 10/16/2020 13:23   CT Head Wo Contrast  Result Date: 10/16/2020 CLINICAL DATA:  Facial and bilateral extremity numbness is noted after trauma. EXAM: CT HEAD WITHOUT CONTRAST  TECHNIQUE: Contiguous axial images were obtained from the base of the skull through the vertex without intravenous contrast. COMPARISON:  None. FINDINGS: Brain: No evidence of acute infarction, hemorrhage, hydrocephalus, extra-axial collection or mass lesion/mass effect. Vascular: No hyperdense vessel or unexpected calcification. Skull: Normal. Negative for fracture or focal lesion. Sinuses/Orbits: No acute finding. Other: None. IMPRESSION: Normal head CT. Electronically Signed   By: Marijo Conception M.D.   On: 10/16/2020 13:18    Scheduled Meds: Continuous Infusions: . potassium chloride 10 mEq (10/16/20 1455)     LOS: 0 days   [place] Brakeman, MD How to contact the [facility] Attending or Consulting provider [place] or covering provider during after hours [place], for this patient?  1. Check the care team in [facility] and look for a) attending/consulting [facility] provider listed and b) the [facility] team listed 2. Log into [email or web address] and use [place]'s universal password to access. If you do not have the password, please contact the hospital operator. 3. Locate the [facility] provider you are looking for under [facility] and page to a number that you can be directly  reached. 4. If you still have difficulty reaching the provider, please page the [facility] (Director on Call) for the Hospitalists listed on amion for assistance.  10/16/2020, 2:59 PM

## 2020-10-17 ENCOUNTER — Telehealth (HOSPITAL_COMMUNITY): Payer: Self-pay | Admitting: Student

## 2020-10-17 LAB — URINE CULTURE

## 2020-10-17 MED ORDER — POTASSIUM CHLORIDE ER 10 MEQ PO TBCR: 10.0000 meq | EXTENDED_RELEASE_TABLET | Freq: Every day | ORAL | 0 refills | Status: DC

## 2020-10-17 MED ORDER — CEPHALEXIN 500 MG PO CAPS: 500.0000 mg | ORAL_CAPSULE | Freq: Two times a day (BID) | ORAL | 0 refills | Status: DC

## 2020-10-17 MED ORDER — CEPHALEXIN 500 MG PO CAPS: 500.0000 mg | ORAL_CAPSULE | Freq: Two times a day (BID) | ORAL | 0 refills | Status: AC

## 2020-10-17 NOTE — Telephone Encounter (Signed)
Spoke with Scott Fitzgerald and changed his previous prescription of potassium from the [facility] in [place] to the [facility] in [place].  Also wrote in a prescription for Keflex to treat his current UTI.  Told him that if he is not starting to feel better in the next few days or his symptoms worsen.  He should come back in to be reevaluated.  He stated that he understood and agreed to said plan.

## 2020-10-23 ENCOUNTER — Other Ambulatory Visit: Payer: Self-pay

## 2020-10-23 ENCOUNTER — Encounter: Payer: Self-pay | Admitting: Nurse Practitioner

## 2020-10-23 ENCOUNTER — Ambulatory Visit (INDEPENDENT_AMBULATORY_CARE_PROVIDER_SITE_OTHER): Payer: Self-pay | Admitting: Nurse Practitioner

## 2020-10-23 ENCOUNTER — Other Ambulatory Visit: Payer: Self-pay | Admitting: Nurse Practitioner

## 2020-10-23 VITALS — BP 127/92 | HR 85 | Temp 97.2°F | Ht 65.0 in | Wt 121.0 lb

## 2020-10-23 DIAGNOSIS — R202 Paresthesia of skin: Secondary | ICD-10-CM | POA: Insufficient documentation

## 2020-10-23 DIAGNOSIS — E876 Hypokalemia: Secondary | ICD-10-CM

## 2020-10-23 DIAGNOSIS — F1011 Alcohol abuse, in remission: Secondary | ICD-10-CM

## 2020-10-23 DIAGNOSIS — Z0000 Encounter for general adult medical examination without abnormal findings: Secondary | ICD-10-CM

## 2020-10-23 DIAGNOSIS — F17200 Nicotine dependence, unspecified, uncomplicated: Secondary | ICD-10-CM

## 2020-10-23 DIAGNOSIS — F419 Anxiety disorder, unspecified: Secondary | ICD-10-CM

## 2020-10-23 DIAGNOSIS — F32A Depression, unspecified: Secondary | ICD-10-CM

## 2020-10-23 DIAGNOSIS — J439 Emphysema, unspecified: Secondary | ICD-10-CM

## 2020-10-23 MED ORDER — THIAMINE HCL 100 MG PO TABS: 100.0000 mg | ORAL_TABLET | Freq: Every day | ORAL | 2 refills | Status: AC

## 2020-10-23 MED ORDER — FOLIC ACID 1 MG PO TABS: 1.0000 mg | ORAL_TABLET | Freq: Every day | ORAL | 1 refills | Status: DC

## 2020-10-23 NOTE — Assessment & Plan Note (Signed)
-  this started about a week ago, and he was seen in the ED -found to have K of 2.5 -K was repleted, and today he is still experiencing this -etiology unknown: may be caused by vitamin/mineral deficiencies form alcoholism, may be hypokalemia or hypomagnesemia, may just be from poor circulation -refer to vascular

## 2020-10-23 NOTE — Patient Instructions (Signed)
I sent in folic acid and thiamine.  Take these daily.  If the numbness in your feet is caused by a nutritional deficiency, these will help.  We are checking labs today, and will notify you if we need to make any other medication changes.  Let's meet back up in 2 weeks for a full physical.  Please get fasting labs prior to this appointment.
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
## 2020-10-23 NOTE — Assessment & Plan Note (Signed)
-  per previous MD note had tried Seroquel, effexor, lexapro, zoloft, xanax, pristiq, celexa -not currently taking anything

## 2020-10-23 NOTE — Assessment & Plan Note (Signed)
-  2ppd x 40 years -is now using smokeless tobacco to cut back on nicotine d/t cough
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
## 2020-10-23 NOTE — Assessment & Plan Note (Signed)
-  had K of 2.5 in ED a week ago -rechecking K today

## 2020-10-23 NOTE — Assessment & Plan Note (Signed)
-  has been a 2 ppd smoker for about 40 years -uses PRN albuterol -no acute issues today

## 2020-10-24 LAB — CMP14+EGFR
ALT: 39 IU/L (ref 0–44)
AST: 48 IU/L — ABNORMAL HIGH (ref 0–40)
Albumin/Globulin Ratio: 1.4 (ref 1.2–2.2)
Albumin: 4.4 g/dL (ref 3.8–4.9)
Alkaline Phosphatase: 101 IU/L (ref 44–121)
BUN/Creatinine Ratio: 9 (ref 9–20)
BUN: 6 mg/dL (ref 6–24)
Bilirubin Total: 0.6 mg/dL (ref 0.0–1.2)
CO2: 21 mmol/L (ref 20–29)
Calcium: 10 mg/dL (ref 8.7–10.2)
Chloride: 102 mmol/L (ref 96–106)
Creatinine, Ser: 0.66 mg/dL — ABNORMAL LOW (ref 0.76–1.27)
GFR calc Af Amer: 126 mL/min/{1.73_m2} (ref >59)
GFR calc non Af Amer: 109 mL/min/{1.73_m2} (ref >59)
Globulin, Total: 3.2 g/dL (ref 1.5–4.5)
Glucose: 106 mg/dL — ABNORMAL HIGH (ref 65–99)
Potassium: 5.7 mmol/L — ABNORMAL HIGH (ref 3.5–5.2)
Sodium: 138 mmol/L (ref 134–144)
Total Protein: 7.6 g/dL (ref 6.0–8.5)

## 2020-10-24 LAB — B12 AND FOLATE PANEL
Folate: 5 ng/mL (ref >3.0)
Vitamin B-12: 258 pg/mL (ref 232–1245)

## 2020-10-24 LAB — MAGNESIUM: Magnesium: 2.2 mg/dL (ref 1.6–2.3)

## 2020-11-07 ENCOUNTER — Other Ambulatory Visit: Payer: Self-pay

## 2020-11-07 ENCOUNTER — Ambulatory Visit (INDEPENDENT_AMBULATORY_CARE_PROVIDER_SITE_OTHER): Payer: Self-pay | Admitting: Nurse Practitioner

## 2020-11-07 ENCOUNTER — Encounter: Payer: Self-pay | Admitting: Nurse Practitioner

## 2020-11-07 VITALS — BP 121/69 | HR 89 | Temp 98.5°F | Resp 20 | Ht 65.0 in | Wt 123.0 lb

## 2020-11-07 DIAGNOSIS — F1011 Alcohol abuse, in remission: Secondary | ICD-10-CM

## 2020-11-07 DIAGNOSIS — R7301 Impaired fasting glucose: Secondary | ICD-10-CM

## 2020-11-07 DIAGNOSIS — Z139 Encounter for screening, unspecified: Secondary | ICD-10-CM

## 2020-11-07 DIAGNOSIS — R202 Paresthesia of skin: Secondary | ICD-10-CM

## 2020-11-07 DIAGNOSIS — F419 Anxiety disorder, unspecified: Secondary | ICD-10-CM

## 2020-11-07 DIAGNOSIS — J439 Emphysema, unspecified: Secondary | ICD-10-CM

## 2020-11-07 DIAGNOSIS — F32A Depression, unspecified: Secondary | ICD-10-CM

## 2020-11-07 DIAGNOSIS — E876 Hypokalemia: Secondary | ICD-10-CM

## 2020-11-07 NOTE — Assessment & Plan Note (Addendum)
-  this started about a week ago, and he was seen in the ED -found to have K of 2.5 -K was repleted, and today he is still experiencing this -likely just from poor circulation -B12, folate, magnesium were all WNL -K has been corrected, and is slightly elevated on last labs -was referred to vascular at last OV, but today his cap refill is a little better; he has 1+ pulses, and his feet have better color to them -if no vascular issue, will consider neurology consult

## 2020-11-07 NOTE — Assessment & Plan Note (Signed)
-  has been a 2 ppd smoker for about 40 years -uses PRN albuterol -no acute issues today
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
## 2020-11-07 NOTE — Assessment & Plan Note (Addendum)
-  resolved -he states he has been eating bananas to increase his K, and today his K is a little on the high side -will monitor with routine labs

## 2020-11-07 NOTE — Assessment & Plan Note (Signed)
-  he states that he recently relapsed -was drinking 2-4 "screwdrivers"; he states they are like wine coolers, not the mixed drink -he states that he has cut back on his drinking some

## 2020-11-07 NOTE — Assessment & Plan Note (Addendum)
-  per previous MD note had tried Seroquel, effexor, lexapro, zoloft, xanax, pristiq, celexa -not currently taking anything -no acute issues

## 2020-11-14 ENCOUNTER — Other Ambulatory Visit: Payer: Self-pay | Admitting: *Deleted

## 2020-11-14 DIAGNOSIS — R202 Paresthesia of skin: Secondary | ICD-10-CM

## 2020-11-27 ENCOUNTER — Other Ambulatory Visit: Payer: Self-pay

## 2020-11-27 ENCOUNTER — Ambulatory Visit (INDEPENDENT_AMBULATORY_CARE_PROVIDER_SITE_OTHER): Payer: Self-pay | Admitting: Vascular Surgery

## 2020-11-27 ENCOUNTER — Ambulatory Visit (INDEPENDENT_AMBULATORY_CARE_PROVIDER_SITE_OTHER): Payer: Self-pay

## 2020-11-27 ENCOUNTER — Encounter: Payer: Self-pay | Admitting: Vascular Surgery

## 2020-11-27 VITALS — BP 131/95 | HR 107 | Temp 97.7°F | Resp 14 | Ht 65.0 in | Wt 119.0 lb

## 2020-11-27 DIAGNOSIS — R202 Paresthesia of skin: Secondary | ICD-10-CM

## 2020-11-27 NOTE — Progress Notes (Signed)
[facility] [place]  Patient name: Scott Fitzgerald MRN: [id number] DOB: May 15, 1966 Sex: male  REASON FOR CONSULT: Evaluation of paresthesias and cold sensation in feet and hands.  HPI: Scott Fitzgerald is a 55 y.o. male, here today for evaluation of complaints regarding cold sensation in his feet and hands.  He has no claudication type symptoms.  Reports paresthesias and numbness in his feet and hands as well.  Reports using heating blanket to keep his feet warm at night.  He has no history of tissue loss.  Past Medical History:  Diagnosis Date  . Alcohol abuse    s/p DWI x1, sober since 09/2009  . Anxiety    with panic disorder, previously on Seroquel, effexor, lexapro, zoloft, xanax, pristiq, celexa  . Aphthous ulcer of mouth 10/08/2013  . Asthma   . Asthma   . Emphysema    was on albuterol, no longer. never had PFT's. doesnt feel can currently afford meds/workup  . Skin rash 09/03/2011  . Social anxiety disorder 07/18/2011  . Tinnitus 09/14/2015    Family History  Problem Relation Age of Onset  . Anxiety disorder Mother        nervous breakdowns  . Stroke Maternal Grandmother   . Coronary artery disease Neg Hx   . Diabetes Neg Hx   . Cancer Neg Hx     SOCIAL HISTORY: Social History   Socioeconomic History  . Marital status: Single    Spouse name: Not on file  . Number of children: Not on file  . Years of education: Not on file  . Highest education level: Not on file  Occupational History  . Occupation: Self employed as Psychologist, educational for cars    Comment: uninsured  Tobacco Use  . Smoking status: Current Every Day Smoker    Packs/day: 2.00    Years: 34.00    Pack years: 68.00    Types: Cigarettes  . Smokeless tobacco: Current User    Types: Chew  . Tobacco comment: started 1 week ago to cut back on cigarettes  Vaping Use  . Vaping Use: Never used  Substance and Sexual Activity  . Alcohol use: Yes    Comment: 2-3  wine coolers/screwdrivers  . Drug use: Not Currently  . Sexual activity: Not Currently    Comment: has erecile dysfunction for 4-5 years  Other Topics Concern  . Not on file  Social History Narrative  . Not on file   Social Determinants of Health   Financial Resource Strain: Not on file  Food Insecurity: Not on file  Transportation Needs: Not on file  Physical Activity: Not on file  Stress: Not on file  Social Connections: Not on file  Intimate Partner Violence: Not on file    No Known Allergies  Current Outpatient Medications  Medication Sig Dispense Refill  . vitamin B-12 (CYANOCOBALAMIN) 1000 MCG tablet Take 1,000 mcg by mouth daily.    . folic acid (FOLVITE) 1 MG tablet Take 1 tablet (1 mg total) by mouth daily. 90 tablet 1  . thiamine 100 MG tablet Take 1 tablet (100 mg total) by mouth daily. 30 tablet 2   No current facility-administered medications for this visit.    REVIEW OF SYSTEMS:  [X]  denotes positive finding, [ ]  denotes negative finding Cardiac  Comments:  Chest pain or chest pressure:    Shortness of breath upon exertion:    Short of breath when lying flat:  Irregular heart rhythm:        Vascular    Pain in calf, thigh, or hip brought on by ambulation:    Pain in feet at night that wakes you up from your sleep:  x   Blood clot in your veins: x   Leg swelling:         Pulmonary    Oxygen at home:    Productive cough:     Wheezing:         Neurologic    Sudden weakness in arms or legs:     Sudden numbness in arms or legs:  x   Sudden onset of difficulty speaking or slurred speech:    Temporary loss of vision in one eye:     Problems with dizziness:         Gastrointestinal    Blood in stool:     Vomited blood:         Genitourinary    Burning when urinating:     Blood in urine:        Psychiatric    Major depression:         Hematologic    Bleeding problems:    Problems with blood clotting too easily:        Skin    Rashes or  ulcers:        Constitutional    Fever or chills:      PHYSICAL EXAM: Vitals:   11/27/20 1033  BP: (!) 131/95  Pulse: (!) 107  Resp: 14  Temp: 97.7 F (36.5 C)  TempSrc: Other (Comment)  SpO2: 96%  Weight: 119 lb (54 kg)  Height: 5\' 5"  (1.651 m)    GENERAL: The patient is a well-nourished male, in no acute distress. The vital signs are documented above. VASCULAR: 2+ radial and 2+ dorsalis pedis pulses bilaterally. PULMONARY: There is good air exchange MUSCULOSKELETAL: There are no major deformities or cyanosis. NEUROLOGIC: No focal weakness or paresthesias are detected. SKIN: There are no ulcers or rashes noted. PSYCHIATRIC: The patient has a normal affect.  DATA:   Noninvasive studies today were done in our office and reviewed with the patient.  This shows normal ankle arm index and normal triphasic pedal waveforms bilaterally  MEDICAL ISSUES:  I discussed these findings with the patient.  Explained that he does not have any evidence of lower extremity or upper extremity arterial insufficiency.  Explained that his symptoms may be related to peripheral neuropathy.  He will continue to follow-up with his primary care provider.  Will see again on an as-needed basis   [place], MD [facility] [place] Office phone [phone]

## 2021-01-19 ENCOUNTER — Emergency Department (HOSPITAL_COMMUNITY): Payer: Self-pay

## 2021-01-19 ENCOUNTER — Other Ambulatory Visit: Payer: Self-pay

## 2021-01-19 ENCOUNTER — Encounter (HOSPITAL_COMMUNITY): Payer: Self-pay

## 2021-01-19 ENCOUNTER — Emergency Department (HOSPITAL_COMMUNITY)
Admission: EM | Admit: 2021-01-19 | Discharge: 2021-01-19 | Disposition: A | Discharge status: Home / Self Care | Payer: Self-pay | Attending: Emergency Medicine | Admitting: Emergency Medicine

## 2021-01-19 DIAGNOSIS — R251 Tremor, unspecified: Secondary | ICD-10-CM | POA: Insufficient documentation

## 2021-01-19 DIAGNOSIS — F17210 Nicotine dependence, cigarettes, uncomplicated: Secondary | ICD-10-CM | POA: Insufficient documentation

## 2021-01-19 DIAGNOSIS — Z20822 Contact with and (suspected) exposure to covid-19: Secondary | ICD-10-CM | POA: Insufficient documentation

## 2021-01-19 DIAGNOSIS — J441 Chronic obstructive pulmonary disease with (acute) exacerbation: Secondary | ICD-10-CM | POA: Insufficient documentation

## 2021-01-19 DIAGNOSIS — J45909 Unspecified asthma, uncomplicated: Secondary | ICD-10-CM | POA: Insufficient documentation

## 2021-01-19 LAB — CBC WITH DIFFERENTIAL/PLATELET
Abs Immature Granulocytes: 0.03 10*3/uL (ref 0.00–0.07)
Basophils Absolute: 0 10*3/uL (ref 0.0–0.1)
Basophils Relative: 1 %
Eosinophils Absolute: 0.2 10*3/uL (ref 0.0–0.5)
Eosinophils Relative: 2 %
HCT: 53.8 % — ABNORMAL HIGH (ref 39.0–52.0)
Hemoglobin: 18 g/dL — ABNORMAL HIGH (ref 13.0–17.0)
Immature Granulocytes: 0 %
Lymphocytes Relative: 12 %
Lymphs Abs: 1 10*3/uL (ref 0.7–4.0)
MCH: 35.7 pg — ABNORMAL HIGH (ref 26.0–34.0)
MCHC: 33.5 g/dL (ref 30.0–36.0)
MCV: 106.7 fL — ABNORMAL HIGH (ref 80.0–100.0)
Monocytes Absolute: 0.8 10*3/uL (ref 0.1–1.0)
Monocytes Relative: 10 %
Neutro Abs: 6.1 10*3/uL (ref 1.7–7.7)
Neutrophils Relative %: 75 %
Platelets: 202 10*3/uL (ref 150–400)
RBC: 5.04 MIL/uL (ref 4.22–5.81)
RDW: 13.1 % (ref 11.5–15.5)
WBC: 8.2 10*3/uL (ref 4.0–10.5)
nRBC: 0 % (ref 0.0–0.2)

## 2021-01-19 LAB — TROPONIN I (HIGH SENSITIVITY)
Troponin I (High Sensitivity): 3 ng/L (ref <18)
Troponin I (High Sensitivity): 5 ng/L (ref <18)

## 2021-01-19 LAB — COMPREHENSIVE METABOLIC PANEL
ALT: 36 U/L (ref 0–44)
AST: 50 U/L — ABNORMAL HIGH (ref 15–41)
Albumin: 3.8 g/dL (ref 3.5–5.0)
Alkaline Phosphatase: 66 U/L (ref 38–126)
Anion gap: 19 — ABNORMAL HIGH (ref 5–15)
BUN: <5 mg/dL — ABNORMAL LOW (ref 6–20)
CO2: 17 mmol/L — ABNORMAL LOW (ref 22–32)
Calcium: 9.1 mg/dL (ref 8.9–10.3)
Chloride: 104 mmol/L (ref 98–111)
Creatinine, Ser: 0.6 mg/dL — ABNORMAL LOW (ref 0.61–1.24)
GFR, Estimated: >60 mL/min (ref >60)
Glucose, Bld: 146 mg/dL — ABNORMAL HIGH (ref 70–99)
Potassium: 3.1 mmol/L — ABNORMAL LOW (ref 3.5–5.1)
Sodium: 140 mmol/L (ref 135–145)
Total Bilirubin: 0.9 mg/dL (ref 0.3–1.2)
Total Protein: 7 g/dL (ref 6.5–8.1)

## 2021-01-19 LAB — RESP PANEL BY RT-PCR (FLU A&B, COVID) ARPGX2
Influenza A by PCR: NEGATIVE
Influenza B by PCR: NEGATIVE
SARS Coronavirus 2 by RT PCR: NEGATIVE

## 2021-01-19 LAB — BRAIN NATRIURETIC PEPTIDE: B Natriuretic Peptide: 45 pg/mL (ref 0.0–100.0)

## 2021-01-19 MED ORDER — ADULT MULTIVITAMIN W/MINERALS CH: 1.0000 | ORAL_TABLET | Freq: Every day | ORAL | Status: DC

## 2021-01-19 MED ORDER — MAGNESIUM SULFATE 2 GM/50ML IV SOLN
2.0000 g | Freq: Once | INTRAVENOUS | Status: AC
  Administered 2021-01-19: 2 g via INTRAVENOUS
  Filled 2021-01-19: qty 50

## 2021-01-19 MED ORDER — FOLIC ACID 1 MG PO TABS: 1.0000 mg | ORAL_TABLET | Freq: Every day | ORAL | Status: DC

## 2021-01-19 MED ORDER — SODIUM CHLORIDE 0.9 % IV BOLUS
500.0000 mL | Freq: Once | INTRAVENOUS | Status: AC
  Administered 2021-01-19: 500 mL via INTRAVENOUS

## 2021-01-19 MED ORDER — LORAZEPAM 1 MG PO TABS: 0.0000 mg | ORAL_TABLET | Freq: Two times a day (BID) | ORAL | Status: DC

## 2021-01-19 MED ORDER — ALBUTEROL SULFATE HFA 108 (90 BASE) MCG/ACT IN AERS
2.0000 | INHALATION_SPRAY | RESPIRATORY_TRACT | 0 refills | Status: DC | PRN
Start: 2021-01-19 — End: 2021-05-08

## 2021-01-19 MED ORDER — LORAZEPAM 1 MG PO TABS
0.0000 mg | ORAL_TABLET | Freq: Four times a day (QID) | ORAL | Status: DC
  Administered 2021-01-19: 3 mg via ORAL
  Filled 2021-01-19: qty 3

## 2021-01-19 MED ORDER — PREDNISONE 50 MG PO TABS: ORAL_TABLET | ORAL | 0 refills | Status: DC

## 2021-01-19 MED ORDER — THIAMINE HCL 100 MG/ML IJ SOLN: 100.0000 mg | Freq: Every day | INTRAMUSCULAR | Status: DC

## 2021-01-19 MED ORDER — IPRATROPIUM-ALBUTEROL 0.5-2.5 (3) MG/3ML IN SOLN: 3.0000 mL | Freq: Once | RESPIRATORY_TRACT | Status: DC

## 2021-01-19 MED ORDER — DOXYCYCLINE HYCLATE 100 MG PO CAPS: 100.0000 mg | ORAL_CAPSULE | Freq: Two times a day (BID) | ORAL | 0 refills | Status: DC

## 2021-01-19 MED ORDER — THIAMINE HCL 100 MG PO TABS: 100.0000 mg | ORAL_TABLET | Freq: Every day | ORAL | Status: DC

## 2021-01-19 MED ORDER — LORAZEPAM 1 MG PO TABS: 1.0000 mg | ORAL_TABLET | ORAL | Status: DC | PRN

## 2021-01-19 MED ORDER — ALBUTEROL SULFATE (2.5 MG/3ML) 0.083% IN NEBU: 2.5000 mg | INHALATION_SOLUTION | Freq: Once | RESPIRATORY_TRACT | Status: DC

## 2021-01-19 MED ORDER — IPRATROPIUM-ALBUTEROL 0.5-2.5 (3) MG/3ML IN SOLN
3.0000 mL | Freq: Once | RESPIRATORY_TRACT | Status: AC
  Administered 2021-01-19: 3 mL via RESPIRATORY_TRACT
  Filled 2021-01-19: qty 3

## 2021-01-19 MED ORDER — IBUPROFEN 400 MG PO TABS
400.0000 mg | ORAL_TABLET | Freq: Once | ORAL | Status: AC
  Administered 2021-01-19: 400 mg via ORAL
  Filled 2021-01-19: qty 1

## 2021-01-19 MED ORDER — ALBUTEROL SULFATE HFA 108 (90 BASE) MCG/ACT IN AERS
4.0000 | INHALATION_SPRAY | Freq: Once | RESPIRATORY_TRACT | Status: AC
  Administered 2021-01-19: 4 via RESPIRATORY_TRACT
  Filled 2021-01-19: qty 6.7

## 2021-01-19 MED ORDER — LORAZEPAM 2 MG/ML IJ SOLN: 1.0000 mg | INTRAMUSCULAR | Status: DC | PRN

## 2021-01-19 MED ORDER — METHYLPREDNISOLONE SODIUM SUCC 125 MG IJ SOLR
80.0000 mg | Freq: Once | INTRAMUSCULAR | Status: AC
  Administered 2021-01-19: 80 mg via INTRAVENOUS
  Filled 2021-01-19: qty 2

## 2021-01-19 NOTE — ED Triage Notes (Addendum)
POV from home with cc of SOB and a non-productive cough for a few days. Has an inhaler but isnt working  94% on room air after ambulating.   Also states that his feet are numb up to his knees x 3 weeks  Says that his vision has been giving him trouble in the last 3 weeks too but has already seen eye doc for it and was rx'd glasses.

## 2021-01-19 NOTE — ED Notes (Signed)
Pt 94% on room air after ambulating
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
## 2021-01-19 NOTE — Discharge Instructions (Addendum)
Follow up next week for recheck.  Return if needed

## 2021-01-25 ENCOUNTER — Telehealth: Payer: Self-pay

## 2021-01-25 ENCOUNTER — Encounter: Payer: Self-pay | Admitting: Nurse Practitioner

## 2021-01-25 ENCOUNTER — Other Ambulatory Visit: Payer: Self-pay | Admitting: Nurse Practitioner

## 2021-01-25 ENCOUNTER — Other Ambulatory Visit: Payer: Self-pay

## 2021-01-25 ENCOUNTER — Ambulatory Visit (INDEPENDENT_AMBULATORY_CARE_PROVIDER_SITE_OTHER): Payer: Self-pay | Admitting: Nurse Practitioner

## 2021-01-25 VITALS — BP 115/75 | HR 83 | Temp 98.0°F | Resp 18 | Ht 65.0 in | Wt 120.0 lb

## 2021-01-25 DIAGNOSIS — J439 Emphysema, unspecified: Secondary | ICD-10-CM

## 2021-01-25 DIAGNOSIS — H04123 Dry eye syndrome of bilateral lacrimal glands: Secondary | ICD-10-CM

## 2021-01-25 MED ORDER — HYPROMELLOSE (GONIOSCOPIC) 2.5 % OP SOLN: 1.0000 [drp] | Freq: Four times a day (QID) | OPHTHALMIC | 12 refills | Status: DC | PRN

## 2021-01-25 MED ORDER — BENZONATATE 100 MG PO CAPS: 100.0000 mg | ORAL_CAPSULE | Freq: Two times a day (BID) | ORAL | 0 refills | Status: DC | PRN

## 2021-01-25 MED ORDER — BREZTRI AEROSPHERE 160-9-4.8 MCG/ACT IN AERO: 2.0000 | INHALATION_SPRAY | Freq: Two times a day (BID) | RESPIRATORY_TRACT | 1 refills | Status: DC

## 2021-01-25 MED ORDER — ALBUTEROL SULFATE (2.5 MG/3ML) 0.083% IN NEBU: 2.5000 mg | INHALATION_SOLUTION | Freq: Four times a day (QID) | RESPIRATORY_TRACT | 1 refills | Status: AC | PRN

## 2021-01-25 MED ORDER — BUDESONIDE-FORMOTEROL FUMARATE 160-4.5 MCG/ACT IN AERO: 2.0000 | INHALATION_SPRAY | Freq: Two times a day (BID) | RESPIRATORY_TRACT | 3 refills | Status: DC

## 2021-01-25 MED ORDER — ALBUTEROL SULFATE (2.5 MG/3ML) 0.083% IN NEBU: 2.5000 mg | INHALATION_SOLUTION | Freq: Four times a day (QID) | RESPIRATORY_TRACT | 1 refills | Status: DC | PRN

## 2021-01-25 NOTE — Telephone Encounter (Signed)
PT CALLED BACK AND STATES THE MEDICATION FOR THE COPD WAS OVER $500 AND HE WAS UNABLE TO AFFORD IT, WERE THERE ANY CHEAPER OPTIONS?

## 2021-01-25 NOTE — Addendum Note (Signed)
Addended by: Lonn Georgia on: 01/25/2021 08:53 AM   Modules accepted: Orders

## 2021-01-25 NOTE — Assessment & Plan Note (Signed)
-  Rx. Artificial tears PRN -if no improvement would consider ophthalmology consult

## 2021-01-25 NOTE — Assessment & Plan Note (Signed)
-  2ppd smoker x 20 years; we discussed smoking cessation, recommended patch and gum -Rx. breztri -Rx. Albuterol nebulizer -Rx. Tessalon for cough

## 2021-01-25 NOTE — Addendum Note (Signed)
Addended by: Demetrius Revel on: 01/25/2021 12:33 PM   Modules accepted: Orders

## 2021-01-25 NOTE — Patient Instructions (Signed)
I sent in [place] for your COPD. There is a number on the savings card. If the medication is expensive, please call it and they may be able to make it more affordable.
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
## 2021-01-25 NOTE — Telephone Encounter (Signed)
I sent in symbicort, but I bet that is still around $200. I am asking a drug rep what the name of the program that [employer] has that helps with the cost of meds.  He may be able to apply for that to get the medication free/cheap.

## 2021-01-25 NOTE — Telephone Encounter (Signed)
Pt informed

## 2021-05-08 ENCOUNTER — Other Ambulatory Visit: Payer: Self-pay

## 2021-05-08 ENCOUNTER — Telehealth: Payer: Self-pay

## 2021-05-08 ENCOUNTER — Encounter: Payer: Self-pay | Admitting: Nurse Practitioner

## 2021-05-08 ENCOUNTER — Ambulatory Visit (INDEPENDENT_AMBULATORY_CARE_PROVIDER_SITE_OTHER): Payer: Self-pay | Admitting: Nurse Practitioner

## 2021-05-08 VITALS — BP 125/69 | HR 78 | Temp 98.6°F | Resp 18 | Ht 65.0 in | Wt 126.0 lb

## 2021-05-08 DIAGNOSIS — J439 Emphysema, unspecified: Secondary | ICD-10-CM

## 2021-05-08 DIAGNOSIS — M79661 Pain in right lower leg: Secondary | ICD-10-CM

## 2021-05-08 DIAGNOSIS — E538 Deficiency of other specified B group vitamins: Secondary | ICD-10-CM

## 2021-05-08 DIAGNOSIS — F1011 Alcohol abuse, in remission: Secondary | ICD-10-CM

## 2021-05-08 DIAGNOSIS — F17200 Nicotine dependence, unspecified, uncomplicated: Secondary | ICD-10-CM

## 2021-05-08 MED ORDER — ALBUTEROL SULFATE HFA 108 (90 BASE) MCG/ACT IN AERS
2.0000 | INHALATION_SPRAY | RESPIRATORY_TRACT | 0 refills | Status: DC | PRN
Start: 2021-05-08 — End: 2022-04-26

## 2021-05-08 MED ORDER — BUDESONIDE-FORMOTEROL FUMARATE 160-4.5 MCG/ACT IN AERO: 2.0000 | INHALATION_SPRAY | Freq: Two times a day (BID) | RESPIRATORY_TRACT | 3 refills | Status: DC

## 2021-05-08 MED ORDER — TRAMADOL HCL 50 MG PO TABS: 50.0000 mg | ORAL_TABLET | Freq: Three times a day (TID) | ORAL | 0 refills | Status: AC | PRN

## 2021-05-08 NOTE — Telephone Encounter (Signed)
Patient called said he went to his pharmacy picked up his pain medicine but did not pick up  his inhaler.  Patient said he needs his inhaler.  Pharmacy: [facility]

## 2021-05-08 NOTE — Assessment & Plan Note (Signed)
-  at his last OV, he reports that he had cut back some on his drinking, and was drinking 204 wine cooler per day -he has been taking a B12 and folate supplement; will check those labs

## 2021-05-08 NOTE — Telephone Encounter (Signed)
Rx sent for inhalers.
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
## 2021-05-08 NOTE — Assessment & Plan Note (Signed)
-  uses albuterol PRN -had symbicort previously, but was prescribed breztri at last OV

## 2021-05-08 NOTE — Patient Instructions (Signed)
Please have fasting labs drawn this week.   We will meet up in 6 months for a physical.
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
## 2021-05-15 ENCOUNTER — Ambulatory Visit: Payer: Self-pay | Admitting: Orthopaedic Surgery

## 2021-11-07 ENCOUNTER — Encounter: Payer: Self-pay | Admitting: Nurse Practitioner

## 2021-11-25 DIAGNOSIS — N321 Vesicointestinal fistula: Secondary | ICD-10-CM

## 2021-11-25 HISTORY — DX: Vesicointestinal fistula: N32.1

## 2022-03-20 ENCOUNTER — Inpatient Hospital Stay
Admission: EM | Admit: 2022-03-20 | Discharge: 2022-03-22 | DRG: 872 | Disposition: A | Discharge status: Home / Self Care | Payer: Self-pay | Attending: Hospitalist | Admitting: Hospitalist

## 2022-03-20 ENCOUNTER — Encounter: Payer: Self-pay | Admitting: Emergency Medicine

## 2022-03-20 ENCOUNTER — Emergency Department: Payer: Self-pay

## 2022-03-20 ENCOUNTER — Other Ambulatory Visit: Payer: Self-pay

## 2022-03-20 DIAGNOSIS — F410 Panic disorder [episodic paroxysmal anxiety] without agoraphobia: Secondary | ICD-10-CM | POA: Diagnosis present

## 2022-03-20 DIAGNOSIS — Z20822 Contact with and (suspected) exposure to covid-19: Secondary | ICD-10-CM | POA: Diagnosis present

## 2022-03-20 DIAGNOSIS — Z79899 Other long term (current) drug therapy: Secondary | ICD-10-CM

## 2022-03-20 DIAGNOSIS — N390 Urinary tract infection, site not specified: Secondary | ICD-10-CM

## 2022-03-20 DIAGNOSIS — F1010 Alcohol abuse, uncomplicated: Secondary | ICD-10-CM | POA: Diagnosis present

## 2022-03-20 DIAGNOSIS — F17200 Nicotine dependence, unspecified, uncomplicated: Secondary | ICD-10-CM

## 2022-03-20 DIAGNOSIS — Z720 Tobacco use: Secondary | ICD-10-CM

## 2022-03-20 DIAGNOSIS — K632 Fistula of intestine: Secondary | ICD-10-CM

## 2022-03-20 DIAGNOSIS — F17210 Nicotine dependence, cigarettes, uncomplicated: Secondary | ICD-10-CM | POA: Diagnosis present

## 2022-03-20 DIAGNOSIS — A419 Sepsis, unspecified organism: Principal | ICD-10-CM | POA: Diagnosis present

## 2022-03-20 DIAGNOSIS — Z823 Family history of stroke: Secondary | ICD-10-CM

## 2022-03-20 DIAGNOSIS — Z7951 Long term (current) use of inhaled steroids: Secondary | ICD-10-CM

## 2022-03-20 DIAGNOSIS — Z818 Family history of other mental and behavioral disorders: Secondary | ICD-10-CM

## 2022-03-20 DIAGNOSIS — J439 Emphysema, unspecified: Secondary | ICD-10-CM | POA: Diagnosis present

## 2022-03-20 DIAGNOSIS — R112 Nausea with vomiting, unspecified: Secondary | ICD-10-CM | POA: Diagnosis present

## 2022-03-20 DIAGNOSIS — F4010 Social phobia, unspecified: Secondary | ICD-10-CM | POA: Diagnosis present

## 2022-03-20 DIAGNOSIS — N322 Vesical fistula, not elsewhere classified: Secondary | ICD-10-CM | POA: Diagnosis present

## 2022-03-20 DIAGNOSIS — R6520 Severe sepsis without septic shock: Secondary | ICD-10-CM | POA: Diagnosis present

## 2022-03-20 DIAGNOSIS — K5792 Diverticulitis of intestine, part unspecified, without perforation or abscess without bleeding: Secondary | ICD-10-CM

## 2022-03-20 DIAGNOSIS — N3001 Acute cystitis with hematuria: Principal | ICD-10-CM | POA: Diagnosis present

## 2022-03-20 DIAGNOSIS — N321 Vesicointestinal fistula: Secondary | ICD-10-CM | POA: Diagnosis present

## 2022-03-20 DIAGNOSIS — R110 Nausea: Secondary | ICD-10-CM | POA: Diagnosis present

## 2022-03-20 DIAGNOSIS — Z597 Insufficient social insurance and welfare support: Secondary | ICD-10-CM

## 2022-03-20 DIAGNOSIS — R062 Wheezing: Secondary | ICD-10-CM

## 2022-03-20 LAB — URINALYSIS, ROUTINE W REFLEX MICROSCOPIC
Glucose, UA: NEGATIVE mg/dL
Ketones, ur: 5 mg/dL — AB
Nitrite: POSITIVE — AB
Protein, ur: 100 mg/dL — AB
RBC / HPF: >50 RBC/hpf — ABNORMAL HIGH (ref 0–5)
Specific Gravity, Urine: 1.023 (ref 1.005–1.030)
Squamous Epithelial / HPF: NONE SEEN (ref 0–5)
WBC, UA: >50 WBC/hpf — ABNORMAL HIGH (ref 0–5)
pH: 5 (ref 5.0–8.0)

## 2022-03-20 LAB — COMPREHENSIVE METABOLIC PANEL
ALT: 40 U/L (ref 0–44)
AST: 63 U/L — ABNORMAL HIGH (ref 15–41)
Albumin: 4 g/dL (ref 3.5–5.0)
Alkaline Phosphatase: 115 U/L (ref 38–126)
Anion gap: 11 (ref 5–15)
BUN: 8 mg/dL (ref 6–20)
CO2: 27 mmol/L (ref 22–32)
Calcium: 9.2 mg/dL (ref 8.9–10.3)
Chloride: 96 mmol/L — ABNORMAL LOW (ref 98–111)
Creatinine, Ser: 0.85 mg/dL (ref 0.61–1.24)
GFR, Estimated: >60 mL/min (ref >60)
Glucose, Bld: 150 mg/dL — ABNORMAL HIGH (ref 70–99)
Potassium: 4 mmol/L (ref 3.5–5.1)
Sodium: 134 mmol/L — ABNORMAL LOW (ref 135–145)
Total Bilirubin: 1.5 mg/dL — ABNORMAL HIGH (ref 0.3–1.2)
Total Protein: 7.7 g/dL (ref 6.5–8.1)

## 2022-03-20 LAB — CBC
HCT: 46 % (ref 39.0–52.0)
Hemoglobin: 16.1 g/dL (ref 13.0–17.0)
MCH: 35.5 pg — ABNORMAL HIGH (ref 26.0–34.0)
MCHC: 35 g/dL (ref 30.0–36.0)
MCV: 101.3 fL — ABNORMAL HIGH (ref 80.0–100.0)
Platelets: 259 10*3/uL (ref 150–400)
RBC: 4.54 MIL/uL (ref 4.22–5.81)
RDW: 12.4 % (ref 11.5–15.5)
WBC: 23.3 10*3/uL — ABNORMAL HIGH (ref 4.0–10.5)
nRBC: 0 % (ref 0.0–0.2)

## 2022-03-20 LAB — APTT: aPTT: 27 seconds (ref 24–36)

## 2022-03-20 LAB — LACTIC ACID, PLASMA
Lactic Acid, Venous: 2 mmol/L (ref 0.5–1.9)
Lactic Acid, Venous: 2.3 mmol/L (ref 0.5–1.9)
Lactic Acid, Venous: 4.1 mmol/L (ref 0.5–1.9)

## 2022-03-20 LAB — RESP PANEL BY RT-PCR (FLU A&B, COVID) ARPGX2
Influenza A by PCR: NEGATIVE
Influenza B by PCR: NEGATIVE
SARS Coronavirus 2 by RT PCR: NEGATIVE

## 2022-03-20 LAB — CHLAMYDIA/NGC RT PCR (ARMC ONLY)
Chlamydia Tr: NOT DETECTED
N gonorrhoeae: NOT DETECTED

## 2022-03-20 LAB — D-DIMER, QUANTITATIVE: D-Dimer, Quant: 0.59 ug/mL-FEU — ABNORMAL HIGH (ref 0.00–0.50)

## 2022-03-20 LAB — PROTIME-INR
INR: 1 (ref 0.8–1.2)
Prothrombin Time: 13.5 seconds (ref 11.4–15.2)

## 2022-03-20 LAB — LIPASE, BLOOD: Lipase: 27 U/L (ref 11–51)

## 2022-03-20 LAB — PROCALCITONIN: Procalcitonin: <0.1 ng/mL

## 2022-03-20 MED ORDER — FOLIC ACID 1 MG PO TABS
1.0000 mg | ORAL_TABLET | Freq: Every day | ORAL | Status: DC
  Administered 2022-03-20 – 2022-03-22 (×3): 1 mg via ORAL
  Filled 2022-03-20 (×3): qty 1

## 2022-03-20 MED ORDER — PIPERACILLIN-TAZOBACTAM 3.375 G IVPB
3.3750 g | Freq: Three times a day (TID) | INTRAVENOUS | Status: DC
Start: 2022-03-20 — End: 2022-03-21
  Administered 2022-03-21: 3.375 g via INTRAVENOUS
  Filled 2022-03-20: qty 50

## 2022-03-20 MED ORDER — ALBUTEROL SULFATE HFA 108 (90 BASE) MCG/ACT IN AERS: 2.0000 | INHALATION_SPRAY | RESPIRATORY_TRACT | Status: DC | PRN

## 2022-03-20 MED ORDER — LORAZEPAM 1 MG PO TABS
1.0000 mg | ORAL_TABLET | ORAL | Status: DC | PRN
  Administered 2022-03-20: 2 mg via ORAL
  Administered 2022-03-20 – 2022-03-21 (×2): 1 mg via ORAL
  Administered 2022-03-22: 2 mg via ORAL
  Filled 2022-03-20: qty 2
  Filled 2022-03-20: qty 1
  Filled 2022-03-20: qty 2
  Filled 2022-03-20: qty 1

## 2022-03-20 MED ORDER — IOHEXOL 350 MG/ML SOLN
100.0000 mL | Freq: Once | INTRAVENOUS | Status: AC | PRN
  Administered 2022-03-20: 100 mL via INTRAVENOUS

## 2022-03-20 MED ORDER — IPRATROPIUM-ALBUTEROL 0.5-2.5 (3) MG/3ML IN SOLN
3.0000 mL | Freq: Four times a day (QID) | RESPIRATORY_TRACT | Status: DC | PRN
  Administered 2022-03-20: 3 mL via RESPIRATORY_TRACT
  Filled 2022-03-20: qty 3

## 2022-03-20 MED ORDER — THIAMINE HCL 100 MG/ML IJ SOLN: 100.0000 mg | Freq: Every day | INTRAMUSCULAR | Status: DC

## 2022-03-20 MED ORDER — METRONIDAZOLE 500 MG/100ML IV SOLN
500.0000 mg | Freq: Once | INTRAVENOUS | Status: AC
  Administered 2022-03-20: 500 mg via INTRAVENOUS
  Filled 2022-03-20: qty 100

## 2022-03-20 MED ORDER — METHYLPREDNISOLONE SODIUM SUCC 125 MG IJ SOLR
125.0000 mg | Freq: Once | INTRAMUSCULAR | Status: AC
  Administered 2022-03-20: 125 mg via INTRAVENOUS
  Filled 2022-03-20: qty 2

## 2022-03-20 MED ORDER — ADULT MULTIVITAMIN W/MINERALS CH
1.0000 | ORAL_TABLET | Freq: Every day | ORAL | Status: DC
  Administered 2022-03-20 – 2022-03-22 (×3): 1 via ORAL
  Filled 2022-03-20 (×3): qty 1

## 2022-03-20 MED ORDER — IPRATROPIUM-ALBUTEROL 0.5-2.5 (3) MG/3ML IN SOLN
3.0000 mL | Freq: Once | RESPIRATORY_TRACT | Status: AC
Start: 2022-03-20 — End: 2022-03-20
  Administered 2022-03-20: 3 mL via RESPIRATORY_TRACT
  Filled 2022-03-20: qty 3

## 2022-03-20 MED ORDER — SODIUM CHLORIDE 0.9 % IV SOLN
1.0000 g | Freq: Once | INTRAVENOUS | Status: AC
  Administered 2022-03-20: 1 g via INTRAVENOUS
  Filled 2022-03-20: qty 10

## 2022-03-20 MED ORDER — ONDANSETRON HCL 4 MG/2ML IJ SOLN
4.0000 mg | Freq: Four times a day (QID) | INTRAMUSCULAR | Status: DC | PRN
  Administered 2022-03-20: 4 mg via INTRAVENOUS
  Filled 2022-03-20: qty 2

## 2022-03-20 MED ORDER — MOMETASONE FURO-FORMOTEROL FUM 200-5 MCG/ACT IN AERO
2.0000 | INHALATION_SPRAY | Freq: Two times a day (BID) | RESPIRATORY_TRACT | Status: DC
Start: 2022-03-21 — End: 2022-03-22
  Administered 2022-03-21 – 2022-03-22 (×2): 2 via RESPIRATORY_TRACT
  Filled 2022-03-20 (×2): qty 8.8

## 2022-03-20 MED ORDER — THIAMINE HCL 100 MG PO TABS
100.0000 mg | ORAL_TABLET | Freq: Every day | ORAL | Status: DC
  Administered 2022-03-20 – 2022-03-22 (×3): 100 mg via ORAL
  Filled 2022-03-20 (×3): qty 1

## 2022-03-20 MED ORDER — PIPERACILLIN-TAZOBACTAM 3.375 G IVPB: 3.3750 g | Freq: Three times a day (TID) | INTRAVENOUS | Status: DC

## 2022-03-20 MED ORDER — LORAZEPAM 2 MG/ML IJ SOLN
1.0000 mg | INTRAMUSCULAR | Status: DC | PRN
  Administered 2022-03-21: 1 mg via INTRAVENOUS
  Filled 2022-03-20: qty 1

## 2022-03-20 MED ORDER — LACTATED RINGERS IV SOLN: INTRAVENOUS | Status: AC

## 2022-03-20 MED ORDER — NICOTINE 21 MG/24HR TD PT24
21.0000 mg | MEDICATED_PATCH | Freq: Once | TRANSDERMAL | Status: DC
  Administered 2022-03-20: 21 mg via TRANSDERMAL
  Filled 2022-03-20: qty 1

## 2022-03-20 MED ORDER — LACTATED RINGERS IV BOLUS
1000.0000 mL | Freq: Once | INTRAVENOUS | Status: AC
  Administered 2022-03-20: 1000 mL via INTRAVENOUS

## 2022-03-20 MED ORDER — ALBUTEROL SULFATE (2.5 MG/3ML) 0.083% IN NEBU: 2.5000 mg | INHALATION_SOLUTION | Freq: Four times a day (QID) | RESPIRATORY_TRACT | Status: DC | PRN

## 2022-03-20 MED ORDER — SODIUM CHLORIDE 0.9 % IV SOLN: 1.0000 g | INTRAVENOUS | Status: DC

## 2022-03-20 NOTE — Progress Notes (Signed)
Pharmacy Antibiotic Note ? ?Scott Fitzgerald is a 56 y.o. male admitted on 03/20/2022 with sepsis.  Pharmacy has been consulted for Zosyn dosing. ? ?Plan: ?Zosyn 3.375g IV q8h (4 hour infusion). ? ?Height: '5\' 5"'$  (165.1 cm) ?Weight: 57.3 kg (126 lb 6.4 oz) ?IBW/kg (Calculated) : 61.5 ? ?Temp (24hrs), Avg:98 ?F (36.7 ?C), Min:97.6 ?F (36.4 ?C), Max:98.4 ?F (36.9 ?C) ? ?Recent Labs  ?Lab 03/20/22 ?1407 03/20/22 ?1533 03/20/22 ?1722 03/20/22 ?2019  ?WBC 23.3*  --   --   --   ?CREATININE 0.85  --   --   --   ?LATICACIDVEN  --  2.0* 2.3* 4.1*  ?  ?Estimated Creatinine Clearance: 77.7 mL/min (by C-G formula based on SCr of 0.85 mg/dL).   ? ?No Known Allergies ? ?Antimicrobials this admission: ?  >>  ?  >>  ? ?Dose adjustments this admission: ? ? ?Microbiology results: ? BCx:  ? UCx:   ? Sputum:   ? MRSA PCR:  ? ?Thank you for allowing pharmacy to be a part of this patient?s care. ? ?Tauni Sanks D ?03/20/2022 10:34 PM ? ?

## 2022-03-20 NOTE — ED Notes (Signed)
Patient placed on 2L [place] after dropping down to 87% on RA. Pt now 94% on 2L [place]. ?

## 2022-03-20 NOTE — ED Triage Notes (Signed)
C/O 4-5 day history of decreased PO intake and chills.  1 month history of decreased appetite. ?

## 2022-03-20 NOTE — Consult Note (Signed)
CODE SEPSIS - PHARMACY COMMUNICATION ? ?**Broad Spectrum Antibiotics should be administered within 1 hour of Sepsis diagnosis** ? ?Time Code Sepsis Called/Page Received: 4888 ? ?Antibiotics Ordered: ceftriaxone ? ?Time of 1st antibiotic administration: 1530 ? ? ? ? ? ?Dorothe Pea, PharmD, BCPS ?Clinical Pharmacist   ?03/20/2022  3:09 PM  ?

## 2022-03-20 NOTE — ED Notes (Signed)
Blood work and cultures sent to lab at this time. ?
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
## 2022-03-20 NOTE — Sepsis Progress Note (Addendum)
Notified provider of need to order repeat lactic acid (3rd). Order received immediatly.   

## 2022-03-20 NOTE — Sepsis Progress Note (Signed)
eLink monitoring code sepsis.  

## 2022-03-20 NOTE — ED Provider Notes (Signed)
? ?[facility] ?Provider Note ? ? ? Event Date/Time  ? First MD Initiated Contact with Patient 03/20/22 1458   ?  (approximate) ? ? ?History  ? ?Chills and Nausea ? ? ?HPI ? ?Scott Fitzgerald Scott Fitzgerald is a 56 y.o. male with past medical history of COPD and asthma as well as anxiety and ongoing tobacco abuse as well as alcohol abuse with patient stating he drinks approximately 12 pack of beer per day none today most recently yesterday who presents for evaluation of 1 month of decreased appetite and about a week of worsening nausea and inability tolerate p.o.  States he has some shortness of breath at baseline as.  Slightly worse today but no fevers, cough, chest pain, earache or sore throat.  Endorses mild headache.  No abdominal pain, diarrhea, constipation or pain with urination only states his urine is very dark and he has had urine tract infections in the past.  Denies any recent trauma or injuries.  Denies any other illicit drug use. ? ?  ?Past Medical History:  ?Diagnosis Date  ? Alcohol abuse   ? s/p DWI x1, sober since 09/2009  ? Anxiety   ? with panic disorder, previously on Seroquel, effexor, lexapro, zoloft, xanax, pristiq, celexa  ? Aphthous ulcer of mouth 10/08/2013  ? Asthma   ? Asthma   ? Emphysema   ? was on albuterol, no longer. never had PFT's. doesnt feel can currently afford meds/workup  ? Skin rash 09/03/2011  ? Social anxiety disorder 07/18/2011  ? Tinnitus 09/14/2015  ? ? ? ?Physical Exam  ?Triage Vital Signs: ?ED Triage Vitals  ?Enc Vitals Group  ?   BP 03/20/22 1350 135/85  ?   Pulse Rate 03/20/22 1350 (!) 109  ?   Resp 03/20/22 1350 19  ?   Temp 03/20/22 1350 97.6 ?F (36.4 ?C)  ?   Temp Source 03/20/22 1350 Oral  ?   SpO2 03/20/22 1350 98 %  ?   Weight 03/20/22 1346 125 lb (56.7 kg)  ?   Height 03/20/22 1346 '5\' 5"'$  (1.651 m)  ?   Head Circumference --   ?   Peak Flow --   ?   Pain Score 03/20/22 1346 0  ?   Pain Loc --   ?   Pain Edu? --   ?   Excl. in [place]? --   ? ? ?Most recent  vital signs: ?Vitals:  ? 03/20/22 1900 03/20/22 1930  ?BP: 95/80 127/70  ?Pulse: 86 81  ?Resp: 20 16  ?Temp:    ?SpO2: 92% 99%  ? ? ?General: Awake, no distress.  ?CV:  Good peripheral perfusion.  2+ radial pulses.  Slightly tachycardic. ?Resp:  Normal effort.  Minimal end extra wheezing bilaterally. ?Abd:  No distention.  Soft throughout.  No CVA tenderness. ?Other:  No significant lower extremity.  Slight tremor in upper extremities. ? ? ?ED Results / Procedures / Treatments  ?Labs ?(all labs ordered are listed, but only abnormal results are displayed) ?Labs Reviewed  ?COMPREHENSIVE METABOLIC PANEL - Abnormal; Notable for the following components:  ?    Result Value  ? Sodium 134 (*)   ? Chloride 96 (*)   ? Glucose, Bld 150 (*)   ? AST 63 (*)   ? Total Bilirubin 1.5 (*)   ? All other components within normal limits  ?CBC - Abnormal; Notable for the following components:  ? WBC 23.3 (*)   ? MCV 101.3 (*)   ?  MCH 35.5 (*)   ? All other components within normal limits  ?URINALYSIS, ROUTINE W REFLEX MICROSCOPIC - Abnormal; Notable for the following components:  ? Color, Urine AMBER (*)   ? APPearance CLOUDY (*)   ? Hgb urine dipstick SMALL (*)   ? Bilirubin Urine SMALL (*)   ? Ketones, ur 5 (*)   ? Protein, ur 100 (*)   ? Nitrite POSITIVE (*)   ? Leukocytes,Ua MODERATE (*)   ? RBC / HPF >50 (*)   ? WBC, UA >50 (*)   ? Bacteria, UA MANY (*)   ? All other components within normal limits  ?LACTIC ACID, PLASMA - Abnormal; Notable for the following components:  ? Lactic Acid, Venous 2.0 (*)   ? All other components within normal limits  ?LACTIC ACID, PLASMA - Abnormal; Notable for the following components:  ? Lactic Acid, Venous 2.3 (*)   ? All other components within normal limits  ?D-DIMER, QUANTITATIVE - Abnormal; Notable for the following components:  ? D-Dimer, Quant 0.59 (*)   ? All other components within normal limits  ?RESP PANEL BY RT-PCR (FLU A&B, COVID) [id number]  ?CHLAMYDIA/NGC RT PCR ([facility] ONLY)             ?CULTURE, BLOOD (ROUTINE X 2)  ?CULTURE, BLOOD (ROUTINE X 2)  ?URINE CULTURE  ?LIPASE, BLOOD  ?PROTIME-INR  ?APTT  ?PROCALCITONIN  ?PROCALCITONIN  ?LACTIC ACID, PLASMA  ?LACTIC ACID, PLASMA  ? ? ? ?EKG ? ?ECG is remarkable sinus rhythm with a ventricular rate of 92, normal axis, unremarkable intervals without clear evidence of acute ischemia or significant arrhythmia. ? ? ?RADIOLOGY ?Chest x-ray without focal consolidation, effusion, edema, pneumothorax or other clear acute process.  Lungs are hyperexpanded and there is evidence of aortic atherosclerosis.  I also reviewed radiologist rotation and agree to findings of emphysema. ? ?CT chest abdomen pelvis on my review without evidence of a pneumonia, PE, overt edema or other acute thoracic process.  There is some thickening of the colon starting for possible diverticulitis but no abscess.  There is apparent abnormal connection to the posterior bladder and I reviewed radiologist rotation and agree with the findings of some air within the urinary bladder and posterior wall thickening as well as soft tissue density containing gas within next and extending from the thickened sigmoid colon consistent with a colovesicular fistula. ? ? ?PROCEDURES: ? ?Critical Care performed: Yes, see critical care procedure note(s) ? ?.Critical Care ?Performed by: Lucrezia Starch, MD ?Authorized by: Lucrezia Starch, MD  ? ?Critical care provider statement:  ?  Critical care time (minutes):  30 ?  Critical care was necessary to treat or prevent imminent or life-threatening deterioration of the following conditions:  Sepsis ?  Critical care was time spent personally by me on the following activities:  Development of treatment plan with patient or surrogate, discussions with consultants, evaluation of patient's response to treatment, examination of patient, ordering and review of laboratory studies, ordering and review of radiographic studies, ordering and performing treatments and  interventions, pulse oximetry, re-evaluation of patient's condition and review of old charts ? ? ? ?MEDICATIONS ORDERED IN ED: ?Medications  ?ipratropium-albuterol (DUONEB) 0.5-2.5 (3) MG/3ML nebulizer solution 3 mL (3 mLs Nebulization Given 03/20/22 1543)  ?LORazepam (ATIVAN) tablet 1-4 mg (1 mg Oral Given 03/20/22 1539)  ?  Or  ?LORazepam (ATIVAN) injection 1-4 mg ( Intravenous See Alternative 03/20/22 1539)  ?thiamine tablet 100 mg (100 mg Oral Given 03/20/22 1540)  ?  Or  ?thiamine (B-1) injection 100 mg ( Intravenous See Alternative 03/20/22 1540)  ?folic acid (FOLVITE) tablet 1 mg (1 mg Oral Given 03/20/22 1540)  ?multivitamin with minerals tablet 1 tablet (1 tablet Oral Given 03/20/22 1539)  ?lactated ringers infusion ( Intravenous New Bag/Given 03/20/22 1541)  ?ondansetron (ZOFRAN) injection 4 mg (4 mg Intravenous Given 03/20/22 1539)  ?nicotine (NICODERM CQ - dosed in mg/24 hours) patch 21 mg (21 mg Transdermal Patch Applied 03/20/22 1616)  ?metroNIDAZOLE (FLAGYL) IVPB 500 mg (has no administration in time range)  ?lactated ringers bolus 1,000 mL (0 mLs Intravenous Stopped 03/20/22 1715)  ?cefTRIAXone (ROCEPHIN) 1 g in sodium chloride 0.9 % 100 mL IVPB (0 g Intravenous Stopped 03/20/22 1610)  ?methylPREDNISolone sodium succinate (SOLU-MEDROL) 125 mg/2 mL injection 125 mg (125 mg Intravenous Given 03/20/22 1946)  ?ipratropium-albuterol (DUONEB) 0.5-2.5 (3) MG/3ML nebulizer solution 3 mL (3 mLs Nebulization Given 03/20/22 1946)  ?iohexol (OMNIPAQUE) 350 MG/ML injection 100 mL (100 mLs Intravenous Contrast Given 03/20/22 1908)  ? ? ? ?IMPRESSION / MDM / ASSESSMENT AND PLAN / ED COURSE  ?I reviewed the triage vital signs and the nursing notes. ?             ?               ? ?Differential diagnosis includes, but is not limited to symptoms related to acute infectious process, metabolic derangement, arrhythmia, malignancy, alcohol abuse malnutrition.  Lower suspicion for SBO given patient states he is having regular bowel  movements.  No history of recent trauma or findings to suggest stroke.  Given he does have history of COPD and ongoing tobacco abuse and some slight wheezing I suspect his shortness of breath is from COPD we will give D

## 2022-03-21 DIAGNOSIS — N390 Urinary tract infection, site not specified: Secondary | ICD-10-CM

## 2022-03-21 DIAGNOSIS — N321 Vesicointestinal fistula: Secondary | ICD-10-CM

## 2022-03-21 LAB — CBC WITH DIFFERENTIAL/PLATELET
Abs Immature Granulocytes: 0.17 10*3/uL — ABNORMAL HIGH (ref 0.00–0.07)
Basophils Absolute: 0 10*3/uL (ref 0.0–0.1)
Basophils Relative: 0 %
Eosinophils Absolute: 0 10*3/uL (ref 0.0–0.5)
Eosinophils Relative: 0 %
HCT: 37 % — ABNORMAL LOW (ref 39.0–52.0)
Hemoglobin: 12.6 g/dL — ABNORMAL LOW (ref 13.0–17.0)
Immature Granulocytes: 1 %
Lymphocytes Relative: 2 %
Lymphs Abs: 0.3 10*3/uL — ABNORMAL LOW (ref 0.7–4.0)
MCH: 35.3 pg — ABNORMAL HIGH (ref 26.0–34.0)
MCHC: 34.1 g/dL (ref 30.0–36.0)
MCV: 103.6 fL — ABNORMAL HIGH (ref 80.0–100.0)
Monocytes Absolute: 0.1 10*3/uL (ref 0.1–1.0)
Monocytes Relative: 1 %
Neutro Abs: 14.2 10*3/uL — ABNORMAL HIGH (ref 1.7–7.7)
Neutrophils Relative %: 96 %
Platelets: 191 10*3/uL (ref 150–400)
RBC: 3.57 MIL/uL — ABNORMAL LOW (ref 4.22–5.81)
RDW: 12.5 % (ref 11.5–15.5)
WBC: 14.8 10*3/uL — ABNORMAL HIGH (ref 4.0–10.5)
nRBC: 0 % (ref 0.0–0.2)

## 2022-03-21 LAB — COMPREHENSIVE METABOLIC PANEL
ALT: 26 U/L (ref 0–44)
AST: 33 U/L (ref 15–41)
Albumin: 2.8 g/dL — ABNORMAL LOW (ref 3.5–5.0)
Alkaline Phosphatase: 80 U/L (ref 38–126)
Anion gap: 8 (ref 5–15)
BUN: 7 mg/dL (ref 6–20)
CO2: 25 mmol/L (ref 22–32)
Calcium: 8.5 mg/dL — ABNORMAL LOW (ref 8.9–10.3)
Chloride: 104 mmol/L (ref 98–111)
Creatinine, Ser: 0.77 mg/dL (ref 0.61–1.24)
GFR, Estimated: >60 mL/min (ref >60)
Glucose, Bld: 209 mg/dL — ABNORMAL HIGH (ref 70–99)
Potassium: 3.7 mmol/L (ref 3.5–5.1)
Sodium: 137 mmol/L (ref 135–145)
Total Bilirubin: 0.8 mg/dL (ref 0.3–1.2)
Total Protein: 5.7 g/dL — ABNORMAL LOW (ref 6.5–8.1)

## 2022-03-21 LAB — APTT: aPTT: 29 seconds (ref 24–36)

## 2022-03-21 LAB — CBG MONITORING, ED
Glucose-Capillary: 234 mg/dL — ABNORMAL HIGH (ref 70–99)
Glucose-Capillary: 162 mg/dL — ABNORMAL HIGH (ref 70–99)
Glucose-Capillary: 176 mg/dL — ABNORMAL HIGH (ref 70–99)

## 2022-03-21 LAB — GLUCOSE, CAPILLARY
Glucose-Capillary: 158 mg/dL — ABNORMAL HIGH (ref 70–99)
Glucose-Capillary: 124 mg/dL — ABNORMAL HIGH (ref 70–99)

## 2022-03-21 LAB — LACTIC ACID, PLASMA
Lactic Acid, Venous: 3.3 mmol/L (ref 0.5–1.9)
Lactic Acid, Venous: 6.5 mmol/L (ref 0.5–1.9)

## 2022-03-21 LAB — PROTIME-INR
INR: 1.1 (ref 0.8–1.2)
Prothrombin Time: 14.4 seconds (ref 11.4–15.2)

## 2022-03-21 LAB — PROCALCITONIN: Procalcitonin: 0.12 ng/mL

## 2022-03-21 MED ORDER — NICOTINE POLACRILEX 2 MG MT GUM
2.0000 mg | CHEWING_GUM | OROMUCOSAL | Status: DC | PRN
  Administered 2022-03-21: 2 mg via ORAL
  Filled 2022-03-21 (×2): qty 1

## 2022-03-21 MED ORDER — PANTOPRAZOLE SODIUM 40 MG IV SOLR
40.0000 mg | Freq: Two times a day (BID) | INTRAVENOUS | Status: DC
Start: 2022-03-21 — End: 2022-03-22
  Administered 2022-03-21 – 2022-03-22 (×4): 40 mg via INTRAVENOUS
  Filled 2022-03-21 (×4): qty 10

## 2022-03-21 MED ORDER — NICOTINE 21 MG/24HR TD PT24
21.0000 mg | MEDICATED_PATCH | Freq: Once | TRANSDERMAL | Status: AC
  Administered 2022-03-21: 21 mg via TRANSDERMAL
  Filled 2022-03-21: qty 1

## 2022-03-21 MED ORDER — PIPERACILLIN-TAZOBACTAM 3.375 G IVPB
3.3750 g | Freq: Three times a day (TID) | INTRAVENOUS | Status: DC
Start: 2022-03-21 — End: 2022-03-22
  Administered 2022-03-21 – 2022-03-22 (×5): 3.375 g via INTRAVENOUS
  Filled 2022-03-21 (×6): qty 50

## 2022-03-21 MED ORDER — LACTATED RINGERS IV BOLUS
1000.0000 mL | Freq: Once | INTRAVENOUS | Status: AC
  Administered 2022-03-21: 1000 mL via INTRAVENOUS

## 2022-03-21 MED ORDER — VANCOMYCIN HCL 1250 MG/250ML IV SOLN
1250.0000 mg | Freq: Once | INTRAVENOUS | Status: AC
  Administered 2022-03-21: 1250 mg via INTRAVENOUS
  Filled 2022-03-21: qty 250

## 2022-03-21 MED ORDER — VANCOMYCIN HCL 750 MG/150ML IV SOLN
750.0000 mg | Freq: Two times a day (BID) | INTRAVENOUS | Status: DC
  Filled 2022-03-21: qty 150

## 2022-03-21 NOTE — ED Notes (Signed)
IV removed by pt.  ?

## 2022-03-21 NOTE — Progress Notes (Signed)
Admission profile updated. ?

## 2022-03-21 NOTE — Assessment & Plan Note (Addendum)
--  supportive care ?

## 2022-03-21 NOTE — ED Notes (Signed)
Provider at bedside talking with pt as he wants to leave since he needs to "take care of his dog". ?

## 2022-03-21 NOTE — ED Notes (Signed)
Report received from Elana RN pt moved to 32 pending hospital bed availability. Will continue to monitor.  ?

## 2022-03-21 NOTE — Assessment & Plan Note (Addendum)
Colovesicular fistula per CT imaging  ?GenSurg consulted ?Plan: ?--per GenSurg, The initial treatment is to treat UTI with antibiotic therapy and once the UTI is clear, proceeding with work-up for possible partial colectomy with resection of colovesical fistula. ?--will need outpatient colonoscopy ?  ? ?

## 2022-03-21 NOTE — Progress Notes (Signed)
Mobility Specialist - Progress Note ? ? ? 03/21/22 1400  ?Mobility  ?Activity Ambulated independently in hallway;Stood at bedside;Dangled on edge of bed  ?Level of Assistance Independent  ?Assistive Device None  ?Distance Ambulated (ft) 320 ft  ?Activity Response Tolerated well  ?$Mobility charge 1 Mobility  ? ? ? ?Pre-mobility: 102 HR,  98% SpO2 ?During mobility: 95 HR, 98% SpO2 ? ?Pt supine upon arrival using RA. Completes bed mobility and STS indep and ambulates 2 laps around nursing station voicing no complaints. Pt is left in bed with needs in reach. ? ?Merrily Brittle ?Mobility Specialist ?03/21/22, 2:42 PM ? ? ? ?

## 2022-03-21 NOTE — Assessment & Plan Note (Signed)
--  nicotine patch and nicorette gum ?
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
## 2022-03-21 NOTE — ED Notes (Signed)
Non-slip socks placed on pt's feet. Pt standing at bedside to use urinal; pt holding bedside rail. ?

## 2022-03-21 NOTE — Assessment & Plan Note (Addendum)
--  cont [place] ? ?

## 2022-03-21 NOTE — TOC Progression Note (Signed)
Transition of Care (TOC) - Progression Note  ? ? ?Patient Details  ?Name: Scott Fitzgerald ?MRN: [id number] ?Date of Birth: [birth year] ? ?Transition of Care (TOC) CM/SW Contact  ?Shelbie Hutching, RN ?Phone Number: ?03/21/2022, 3:31 PM ? ?Clinical Narrative:    ?RNCM reached out to [employer] to screen patient to see if he may be eligible for Medicaid.  He is currently uninsured and he will be needing extensive medical follow up including a surgery.   ? ? ?Expected Discharge Plan: Home/Self Care ?Barriers to Discharge: Continued Medical Work up ? ?Expected Discharge Plan and Services ?Expected Discharge Plan: Home/Self Care ?  ?Discharge Planning Services: CM Consult ?  ?Living arrangements for the past 2 months: [place] ?                ?DME Arranged: N/A ?DME Agency: NA ?  ?  ?  ?HH Arranged: NA ?[place] Agency: NA ?  ?  ?  ? ? ?Social Determinants of Health (SDOH) Interventions ?  ? ?Readmission Risk Interventions ?   ? View : No data to display.  ?  ?  ?  ? ? ?

## 2022-03-21 NOTE — Assessment & Plan Note (Addendum)
--  tachycardia, leukocytosis, elevated lactic acid.  Received IVF and started on IV vanc and zosyn.  Source is UTI. ?Plan: ?--cont zosyn ?--d/c IV vanc ? ?

## 2022-03-21 NOTE — ED Notes (Signed)
Pt reports he had recently drank some tea; tray at bedside; pt reports it was from breakfast but looks like a lunch tray and staff had recently dropped off lunch trays. Either way, pt had already tried some items from tray before this RN was able to complete sugar check.  ?

## 2022-03-21 NOTE — Assessment & Plan Note (Signed)
--  started on zosyn ?Plan: ?--cont zosyn pending urine cx results ?

## 2022-03-21 NOTE — Progress Notes (Signed)
Pharmacy Antibiotic Note ? ?Scott Fitzgerald is a 56 y.o. male admitted on 03/20/2022 with sepsis.  Pharmacy has been consulted for Vancomycin dosing. ? ?Plan: ?Vancomycin 1250 mg IV X 1 ordered for 4/27 @ 0300.  ?Vancomycin 750 mg IV Q12H ordered to start on 4/27 @ 1500.  ? ?AUC = 527.7  ?Vanc trough = 15.1 mcg/mL  ? ?Height: '5\' 5"'$  (165.1 cm) ?Weight: 57.3 kg (126 lb 6.4 oz) ?IBW/kg (Calculated) : 61.5 ? ?Temp (24hrs), Avg:98 ?F (36.7 ?C), Min:97.6 ?F (36.4 ?C), Max:98.4 ?F (36.9 ?C) ? ?Recent Labs  ?Lab 03/20/22 ?1407 03/20/22 ?1533 03/20/22 ?1722 03/20/22 ?2019  ?WBC 23.3*  --   --   --   ?CREATININE 0.85  --   --   --   ?LATICACIDVEN  --  2.0* 2.3* 4.1*  ?  ?Estimated Creatinine Clearance: 77.7 mL/min (by C-G formula based on SCr of 0.85 mg/dL).   ? ?No Known Allergies ? ?Antimicrobials this admission: ?  >>  ?  >>  ? ?Dose adjustments this admission: ? ? ?Microbiology results: ? BCx:  ? UCx:   ? Sputum:   ? MRSA PCR:  ? ?Thank you for allowing pharmacy to be a part of this patient?s care. ? ?Delailah Spieth D ?03/21/2022 2:17 AM ? ?
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
## 2022-03-21 NOTE — Assessment & Plan Note (Addendum)
We will start pt on CIWA protocol. ?Thiamine 100 mg.  ?IV ppi.  ?

## 2022-03-21 NOTE — Progress Notes (Signed)
?  Progress Note ? ? ?Patient: Scott [facility] NWG:[id number] DOB: 05/15/1966 DOA: 03/20/2022     1 ?DOS: the patient was seen and examined on 03/21/2022 ?  ?Brief hospital course: ?No notes on file ? ?Assessment and Plan: ?* Colovesical fistula ?Colovesicular fistula per CT imaging  ?GenSurg consulted ?Plan: ?--per GenSurg, The initial treatment is to treat UTI with antibiotic therapy and once the UTI is clear, proceeding with work-up for possible partial colectomy with resection of colovesical fistula. ?--will need outpatient colonoscopy ?  ? ? ?Nausea ?--supportive care ? ? ?Alcohol abuse ?We will start pt on CIWA protocol. ?Thiamine 100 mg.  ?IV ppi.  ? ?Severe sepsis ([place]) ?--tachycardia, leukocytosis, elevated lactic acid.  Received IVF and started on IV vanc and zosyn.  Source is UTI. ?Plan: ?--cont zosyn ?--d/c IV vanc ? ? ?Emphysema of lung ([place]) ?--cont Dulera ? ? ?UTI (urinary tract infection) ?--started on zosyn ?Plan: ?--cont zosyn pending urine cx results ? ?Current every day smoker ?--nicotine patch and nicorette gum ? ? ? ? ?  ? ?Subjective:  ?Pt denied dysuria, bladder pain.  Pt said he had to go home by tomorrow morning to tend to his dog. ? ? ?Physical Exam: ? ?Constitutional: NAD, AAOx3 ?HEENT: conjunctivae and lids normal, EOMI ?CV: No cyanosis.   ?RESP: normal respiratory effort, on RA ?Neuro: II - XII grossly intact.   ?Psych: Normal mood and affect.   ? ? ?Data Reviewed: ? ?Family Communication:  ? ?Disposition: ?Status is: Inpatient ? ? Planned Discharge Destination: Home ? ? ? ?Time spent: 50 minutes ? ?Author: ?Enzo Bi, MD ?03/21/2022 5:51 PM ? ?For on call review [email or web address].  ?

## 2022-03-22 ENCOUNTER — Other Ambulatory Visit: Payer: Self-pay

## 2022-03-22 LAB — BASIC METABOLIC PANEL
Anion gap: 7 (ref 5–15)
BUN: <5 mg/dL — ABNORMAL LOW (ref 6–20)
CO2: 26 mmol/L (ref 22–32)
Calcium: 8.9 mg/dL (ref 8.9–10.3)
Chloride: 103 mmol/L (ref 98–111)
Creatinine, Ser: 0.65 mg/dL (ref 0.61–1.24)
GFR, Estimated: >60 mL/min (ref >60)
Glucose, Bld: 110 mg/dL — ABNORMAL HIGH (ref 70–99)
Potassium: 3.3 mmol/L — ABNORMAL LOW (ref 3.5–5.1)
Sodium: 136 mmol/L (ref 135–145)

## 2022-03-22 LAB — CBC
HCT: 34.6 % — ABNORMAL LOW (ref 39.0–52.0)
Hemoglobin: 12 g/dL — ABNORMAL LOW (ref 13.0–17.0)
MCH: 35.4 pg — ABNORMAL HIGH (ref 26.0–34.0)
MCHC: 34.7 g/dL (ref 30.0–36.0)
MCV: 102.1 fL — ABNORMAL HIGH (ref 80.0–100.0)
Platelets: 198 10*3/uL (ref 150–400)
RBC: 3.39 MIL/uL — ABNORMAL LOW (ref 4.22–5.81)
RDW: 12.5 % (ref 11.5–15.5)
WBC: 13.5 10*3/uL — ABNORMAL HIGH (ref 4.0–10.5)
nRBC: 0 % (ref 0.0–0.2)

## 2022-03-22 LAB — GLUCOSE, CAPILLARY
Glucose-Capillary: 102 mg/dL — ABNORMAL HIGH (ref 70–99)
Glucose-Capillary: 113 mg/dL — ABNORMAL HIGH (ref 70–99)
Glucose-Capillary: 117 mg/dL — ABNORMAL HIGH (ref 70–99)

## 2022-03-22 LAB — LACTIC ACID, PLASMA: Lactic Acid, Venous: 3.5 mmol/L (ref 0.5–1.9)

## 2022-03-22 LAB — MAGNESIUM: Magnesium: 1.8 mg/dL (ref 1.7–2.4)

## 2022-03-22 LAB — PROCALCITONIN: Procalcitonin: <0.1 ng/mL

## 2022-03-22 MED ORDER — IBUPROFEN 400 MG PO TABS
600.0000 mg | ORAL_TABLET | Freq: Once | ORAL | Status: AC
  Administered 2022-03-22: 600 mg via ORAL
  Filled 2022-03-22: qty 2

## 2022-03-22 MED ORDER — AMOXICILLIN-POT CLAVULANATE 875-125 MG PO TABS
1.0000 | ORAL_TABLET | Freq: Two times a day (BID) | ORAL | 0 refills | Status: AC
  Filled 2022-03-22: qty 14, 7d supply, fill #0

## 2022-03-22 MED ORDER — THIAMINE HCL 100 MG PO TABS: 100.0000 mg | ORAL_TABLET | Freq: Every day | ORAL | Status: DC

## 2022-03-22 MED ORDER — POTASSIUM CHLORIDE CRYS ER 20 MEQ PO TBCR
40.0000 meq | EXTENDED_RELEASE_TABLET | Freq: Once | ORAL | Status: AC
  Administered 2022-03-22: 40 meq via ORAL
  Filled 2022-03-22: qty 2

## 2022-03-22 MED ORDER — AMOXICILLIN-POT CLAVULANATE 875-125 MG PO TABS: 1.0000 | ORAL_TABLET | Freq: Two times a day (BID) | ORAL | 0 refills | Status: DC

## 2022-03-22 MED ORDER — ENOXAPARIN SODIUM 40 MG/0.4ML IJ SOSY
40.0000 mg | PREFILLED_SYRINGE | INTRAMUSCULAR | Status: DC
  Filled 2022-03-22: qty 0.4

## 2022-03-22 MED ORDER — FOLIC ACID 1 MG PO TABS: 1.0000 mg | ORAL_TABLET | Freq: Every day | ORAL | Status: DC

## 2022-03-22 MED ORDER — NICOTINE 21 MG/24HR TD PT24: 21.0000 mg | MEDICATED_PATCH | Freq: Every day | TRANSDERMAL | 0 refills | Status: DC

## 2022-03-22 NOTE — Progress Notes (Signed)
Patient ID: Koven Belinsky, male   DOB: Feb 20, 1966, 56 y.o.   MRN: [id number] ?    SURGICAL PROGRESS NOTE  ? ?Hospital Day(s): 2.  ? ?Interval History: Patient seen and examined today.  Patient was found standing at the door of his room saying that he needs to go to a to take care of his dog. ? ?Patient denies abdominal pain.  Patient denies any fevers.  Patient is tolerating diet without any nausea. ? ?Vital signs in last 24 hours: [min-max] current  ?Temp:  [98.1 ?F (36.7 ?C)-99.1 ?F (37.3 ?C)] 99.1 ?F (37.3 ?C) (04/28 0102) ?Pulse Rate:  [65-85] 69 (04/28 0821) ?Resp:  [18] 18 (04/28 7253) ?BP: (109-127)/(68-72) 123/68 (04/28 6644) ?SpO2:  [94 %-97 %] 97 % (04/28 0821)     Height: '5\' 5"'$  (165.1 cm) Weight: 57.3 kg BMI (Calculated): 21.03  ? ?Physical Exam:  ?Constitutional: alert, cooperative and no distress  ?Respiratory: breathing non-labored at rest  ?Cardiovascular: regular rate and sinus rhythm  ?Gastrointestinal: soft, non-tender, and non-distended ? ?Labs:  ? ?  Latest Ref Rng & Units 03/22/2022  ?  4:21 AM 03/21/2022  ?  4:21 AM 03/20/2022  ?  2:07 PM  ?CBC  ?WBC 4.0 - 10.5 K/uL 13.5   14.8   23.3    ?Hemoglobin 13.0 - 17.0 g/dL 12.0   12.6   16.1    ?Hematocrit 39.0 - 52.0 % 34.6   37.0   46.0    ?Platelets 150 - 400 K/uL 198   191   259    ? ? ?  Latest Ref Rng & Units 03/22/2022  ?  4:21 AM 03/21/2022  ?  4:21 AM 03/20/2022  ?  2:07 PM  ?CMP  ?Glucose 70 - 99 mg/dL 110   209   150    ?BUN 6 - 20 mg/dL '5   7   8    '$ ?Creatinine 0.61 - 1.24 mg/dL 0.65   0.77   0.85    ?Sodium 135 - 145 mmol/L 136   137   134    ?Potassium 3.5 - 5.1 mmol/L 3.3   3.7   4.0    ?Chloride 98 - 111 mmol/L 103   104   96    ?CO2 22 - 32 mmol/L '26   25   27    '$ ?Calcium 8.9 - 10.3 mg/dL 8.9   8.5   9.2    ?Total Protein 6.5 - 8.1 g/dL  5.7   7.7    ?Total Bilirubin 0.3 - 1.2 mg/dL  0.8   1.5    ?Alkaline Phos 38 - 126 U/L  80   115    ?AST 15 - 41 U/L  33   63    ?ALT 0 - 44 U/L  26   40    ? ? ?Imaging studies: No new pertinent  imaging studies ? ? ?Assessment/Plan:  ?56 y.o. male with UTI, complicated by pertinent comorbidities including colovesical fistula. ? ?-Patient with adequate vital signs, no fever ?-Patient without abdominal pain on physical exam ?-Labs shows continued decreasing trend of white blood cell count ?-Patient tolerating diet without nausea ?-From the surgical standpoint patient can be discharged with oral antibiotic therapy if medically stable. ?-I will follow-up patient in my office in 2 weeks ?-Patient will need diagnostic colonoscopy, evaluation by urology with possible cystoscopy before discussing possible elective partial colectomy. ? ?Gwendolyn Grant, MD ? ? ? ?

## 2022-03-22 NOTE — Progress Notes (Signed)
Mobility Specialist - Progress Note ? ? ? 03/22/22 1100  ?Mobility  ?Activity Ambulated independently in hallway;Stood at bedside;Ambulated independently to bathroom;Dangled on edge of bed  ?Level of Assistance Independent  ?Assistive Device None  ?Distance Ambulated (ft) 160 ft  ?Activity Response Tolerated well  ?$Mobility charge 1 Mobility  ? ? ?During mobility: 78 HR, 98% SpO2 ? ?Pt supine upon arrival using RA. Completes all activities indep voicing "not feeling well" from lack of sleep throughout. Pt completes bed mobility, STS  and ambulates 158f and returns to bed with needs in reach. ? ?MMerrily Brittle?Mobility Specialist ?03/22/22, 11:18 AM ? ? ?

## 2022-03-22 NOTE — TOC Progression Note (Signed)
Transition of Care (TOC) - Progression Note  ? ? ?Patient Details  ?Name: Scott Fitzgerald ?MRN: [id number] ?Date of Birth: 29-Jul-1966 ? ?Transition of Care (TOC) CM/SW Contact  ?Laurena Slimmer, RN ?Phone Number: ?03/22/2022, 3:03 PM ? ?Clinical Narrative:    ?Discharge order received. Patient provided with information for [facility], technical sales for Augmentin pick up. TOC signing off ? ? ?Expected Discharge Plan: Home/Self Care ?Barriers to Discharge: Continued Medical Work up ? ?Expected Discharge Plan and Services ?Expected Discharge Plan: Home/Self Care ?  ?Discharge Planning Services: CM Consult ?  ?Living arrangements for the past 2 months: [place] ?Expected Discharge Date: 03/22/22               ?DME Arranged: N/A ?DME Agency: NA ?  ?  ?  ?HH Arranged: NA ?[place] Agency: NA ?  ?  ?  ? ? ?Social Determinants of Health (SDOH) Interventions ?  ? ?Readmission Risk Interventions ?   ? View : No data to display.  ?  ?  ?  ? ? ?

## 2022-03-22 NOTE — Progress Notes (Signed)
Mobility Specialist - Progress Note ? ? 03/22/22 1400  ?Mobility  ?Activity Ambulated independently in hallway  ?Level of Assistance Independent  ?Assistive Device None  ?Distance Ambulated (ft) 80 ft  ?Activity Response Tolerated well  ?$Mobility charge 1 Mobility  ? ? ? ?Pt ambulated hallway independently. No complaints, ready to d/c.  ? ? ?Kathee Delton ?Mobility Specialist ?03/22/22, 2:32 PM ? ? ? ? ?

## 2022-03-22 NOTE — Plan of Care (Signed)

## 2022-03-22 NOTE — Discharge Summary (Signed)
? ?Physician Discharge Summary ? ? ?[facility]  male DOB: 1965/12/30  ?SWH:[id number] ? ?PCP: Noreene Larsson, NP ? ?Admit date: 03/20/2022 ?Discharge date: 03/22/2022 ? ?Admitted From: home ?Disposition:  home ?CODE STATUS: Full code ? ?Hospital Course:  ?For full details, please see H&P, progress notes, consult notes and ancillary notes.  ?Briefly,  ?Scott Fitzgerald is a 56 y.o. male with medical history significant of alcohol abuse, tobacco abuse, emphysema presenting with multiple generalized symptoms of malaise, nausea and vomiting and inability to tolerate p.o.   ? ?* Colovesical fistula ?Colovesicular fistula per CT imaging  ?GenSurg consulted, rec starting zosyn. ?--per GenSurg, The initial treatment is to treat UTI with antibiotic therapy and once the UTI is clear, proceeding with work-up for possible partial colectomy with resection of colovesical fistula. ?--will need outpatient colonoscopy ?--Pt does not have insurance, which is a significant barrier to the above workup.  TOC reached out to [employer] solutions to screen patient to see if he may be eligible for Medicaid.  Pt does have his own PCP and prefers to follow up with his own PCP. ?  ?UTI ?--pt received 2 days of zosyn.  Pt insisted on leaving due to the need to go home to take care of his dog.  Urine cx did not result by the time of discharge.  GenSurg rec discharging on a course of Augmentin, which was provided to pt prior to discharge. ?  ?Nausea, resolved ?--supportive care ?  ?Alcohol abuse ?--pt advised to gradually taper off his alcohol use. ?  ?Severe sepsis ([place]) ?--tachycardia, leukocytosis, elevated lactic acid.  Received IVF and started on IV vanc and zosyn.  Source is UTI. ?  ?Emphysema of lung ([place]) ?--cont Dulera ?  ?Current every day smoker ?--nicotine patch and nicorette gum.   ?  ? ?Discharge Diagnoses:  ?Principal Problem: ?  Colovesical fistula ?Active Problems: ?  Nausea ?  Severe sepsis ([place]) ?  Alcohol  abuse ?  Emphysema of lung ([place]) ?  Current every day smoker ?  UTI (urinary tract infection) ? ? ?30 Day Unplanned Readmission Risk Score   ? ?Flowsheet Row ED to Hosp-Admission (Current) from 03/20/2022 in [place]  ?30 Day Unplanned Readmission Risk Score (%) 6.89 Filed at 03/22/2022 1200  ? ?  ? ? This score is the patient's risk of an unplanned readmission within 30 days of being discharged (0 -100%). The score is based on dignosis, age, lab data, medications, orders, and past utilization.   ?Low:  0-14.9   Medium: 15-21.9   High: 22-29.9   Extreme: 30 and above ? ?  ? ?  ? ? ?Discharge Instructions: ? ?Allergies as of 03/22/2022   ?No Known Allergies ?  ? ?  ?Medication List  ?  ? ?TAKE these medications   ? ?albuterol (2.5 MG/3ML) 0.083% nebulizer solution ?Commonly known as: PROVENTIL ?Take 3 mLs (2.5 mg total) by nebulization every 6 (six) hours as needed for wheezing or shortness of breath. ?  ?albuterol 108 (90 Base) MCG/ACT inhaler ?Commonly known as: VENTOLIN HFA ?Inhale 2 puffs into the lungs every 4 (four) hours as needed for wheezing or shortness of breath. ?  ?amoxicillin-clavulanate 875-125 MG tablet ?Commonly known as: Augmentin ?Take 1 tablet by mouth 2 (two) times daily for 7 days. ?  ?folic acid 1 MG tablet ?Commonly known as: FOLVITE ?Take 1 tablet (1 mg total) by mouth daily. ?Start taking on: March 23, 2022 ?  ?  nicotine 21 mg/24hr patch ?Commonly known as: NICODERM CQ - dosed in mg/24 hours ?Place 1 patch (21 mg total) onto the skin daily. ?  ?thiamine 100 MG tablet ?Take 1 tablet (100 mg total) by mouth daily. ?Start taking on: March 23, 2022 ?  ? ?  ? ? ? Follow-up Information   ? ? [facility] Follow up in 1 week(s).   ?Specialty: Primary Care ?Contact information: ?[address] ?[address] ?[place] ?[phone] ? ?  ?  ? ?  ?  ? ?  ? ? ?No Known Allergies ? ? ?The results of significant diagnostics from  this hospitalization (including imaging, microbiology, ancillary and laboratory) are listed below for reference.  ? ?Consultations: ? ? ?Procedures/Studies: ?DG Chest 2 View ? ?Result Date: 03/20/2022 ?CLINICAL DATA:  Shortness of breath. Reduced oral intake/appetite. Chills. EXAM: CHEST - 2 VIEW COMPARISON:  01/19/2021 FINDINGS: Emphysema.  Mild atherosclerotic calcification of the aortic arch. The lungs appear otherwise clear. No blunting of the costophrenic angles. Mild lower thoracic spondylosis. Heart size within normal limits. No significant abnormal bony findings. IMPRESSION: 1. No acute findings. 2. Aortic Atherosclerosis (ICD10-I70.0) and Emphysema (ICD10-J43.9). Electronically Signed   By: Van Clines M.D.   On: 03/20/2022 16:17  ? ?CT Angio Chest PE W and/or Wo Contrast ? ?Result Date: 03/20/2022 ?CLINICAL DATA:  Sepsis EXAM: CT ANGIOGRAPHY CHEST CT ABDOMEN AND PELVIS WITH CONTRAST TECHNIQUE: Multidetector CT imaging of the chest was performed using the standard protocol during bolus administration of intravenous contrast. Multiplanar CT image reconstructions and MIPs were obtained to evaluate the vascular anatomy. Multidetector CT imaging of the abdomen and pelvis was performed using the standard protocol during bolus administration of intravenous contrast. RADIATION DOSE REDUCTION: This exam was performed according to the departmental dose-optimization program which includes automated exposure control, adjustment of the mA and/or kV according to patient size and/or use of iterative reconstruction technique. CONTRAST:  1103m OMNIPAQUE IOHEXOL 350 MG/ML SOLN COMPARISON:  Chest x-ray 03/20/2022 FINDINGS: CTA CHEST FINDINGS Cardiovascular: Satisfactory opacification of the pulmonary arteries to the segmental level. No evidence of pulmonary embolism. Normal heart size. No pericardial effusion. Nonaneurysmal aorta. Mild atherosclerosis. No dissection. Mediastinum/Nodes: No enlarged mediastinal, hilar, or  axillary lymph nodes. Thyroid gland, trachea, and esophagus demonstrate no significant findings. Lungs/Pleura: Emphysema. No acute airspace disease, pleural effusion or pneumothorax Musculoskeletal: No chest wall abnormality. No acute or significant osseous findings. Review of the MIP images confirms the above findings. CT ABDOMEN and PELVIS FINDINGS Hepatobiliary: No focal liver abnormality is seen. No gallstones, gallbladder wall thickening, or biliary dilatation. Pancreas: Unremarkable. No pancreatic ductal dilatation or surrounding inflammatory changes. Spleen: Normal in size without focal abnormality. Adrenals/Urinary Tract: Adrenal glands are normal. Kidneys show no hydronephrosis. Moderate air within the urinary bladder. Wall thickening of the posterior aspect of the bladder. Stomach/Bowel: Stomach nonenlarged. No dilated small bowel. Negative appendix. Sigmoid colon diverticular disease. Thickened appearance of the sigmoid colon with soft tissue tract containing gas and fluid extending from the sigmoid colon to the posterior aspect of the bladder, series 2, image 68 and sagittal series 6, image 58, consistent with a colovesical fistula. Vascular/Lymphatic: Moderate aortic atherosclerosis. No aneurysm. No suspicious lymph nodes. Reproductive: Prostate is unremarkable. Other: Negative for pelvic effusion or free air. Musculoskeletal: No acute or significant osseous findings. Review of the MIP images confirms the above findings. IMPRESSION: 1. Negative for acute pulmonary embolus. Emphysema with no acute airspace disease. 2. Diverticular disease of the sigmoid  colon with thickened appearance of the sigmoid colon. Moderate air within the urinary bladder which also exhibits posterior wall thickening. There is a soft tissue density that also contains gas within, extending from the thickened sigmoid colon to the posterior aspect of the urinary bladder consistent with a colovesical fistula. Electronically Signed    By: Donavan Foil M.D.   On: 03/20/2022 19:37  ? ?CT ABDOMEN PELVIS W CONTRAST ? ?Result Date: 03/20/2022 ?CLINICAL DATA:  Sepsis EXAM: CT ANGIOGRAPHY CHEST CT ABDOMEN AND PELVIS WITH CONTRAST TECHNIQUE: M

## 2022-03-23 LAB — URINE CULTURE: Culture: >=100000 — AB

## 2022-03-25 LAB — CULTURE, BLOOD (ROUTINE X 2)
Culture: NO GROWTH
Culture: NO GROWTH

## 2022-04-04 ENCOUNTER — Other Ambulatory Visit: Payer: Self-pay

## 2022-04-04 ENCOUNTER — Observation Stay
Admission: EM | Admit: 2022-04-04 | Discharge: 2022-04-05 | Disposition: A | Discharge status: Home / Self Care | Payer: Self-pay | Attending: Internal Medicine | Admitting: Internal Medicine

## 2022-04-04 DIAGNOSIS — J449 Chronic obstructive pulmonary disease, unspecified: Secondary | ICD-10-CM | POA: Insufficient documentation

## 2022-04-04 DIAGNOSIS — F17200 Nicotine dependence, unspecified, uncomplicated: Secondary | ICD-10-CM | POA: Diagnosis present

## 2022-04-04 DIAGNOSIS — N321 Vesicointestinal fistula: Secondary | ICD-10-CM | POA: Diagnosis present

## 2022-04-04 DIAGNOSIS — N390 Urinary tract infection, site not specified: Principal | ICD-10-CM | POA: Diagnosis present

## 2022-04-04 DIAGNOSIS — F1010 Alcohol abuse, uncomplicated: Secondary | ICD-10-CM | POA: Diagnosis present

## 2022-04-04 DIAGNOSIS — Z79899 Other long term (current) drug therapy: Secondary | ICD-10-CM | POA: Insufficient documentation

## 2022-04-04 DIAGNOSIS — J439 Emphysema, unspecified: Secondary | ICD-10-CM | POA: Diagnosis present

## 2022-04-04 DIAGNOSIS — J45909 Unspecified asthma, uncomplicated: Secondary | ICD-10-CM | POA: Insufficient documentation

## 2022-04-04 DIAGNOSIS — F17210 Nicotine dependence, cigarettes, uncomplicated: Secondary | ICD-10-CM | POA: Insufficient documentation

## 2022-04-04 LAB — CBC
HCT: 45.4 % (ref 39.0–52.0)
Hemoglobin: 15.9 g/dL (ref 13.0–17.0)
MCH: 35.5 pg — ABNORMAL HIGH (ref 26.0–34.0)
MCHC: 35 g/dL (ref 30.0–36.0)
MCV: 101.3 fL — ABNORMAL HIGH (ref 80.0–100.0)
Platelets: 322 10*3/uL (ref 150–400)
RBC: 4.48 MIL/uL (ref 4.22–5.81)
RDW: 12.4 % (ref 11.5–15.5)
WBC: 8.9 10*3/uL (ref 4.0–10.5)
nRBC: 0 % (ref 0.0–0.2)

## 2022-04-04 LAB — COMPREHENSIVE METABOLIC PANEL
ALT: 22 U/L (ref 0–44)
AST: 30 U/L (ref 15–41)
Albumin: 3.9 g/dL (ref 3.5–5.0)
Alkaline Phosphatase: 68 U/L (ref 38–126)
Anion gap: 9 (ref 5–15)
BUN: 10 mg/dL (ref 6–20)
CO2: 27 mmol/L (ref 22–32)
Calcium: 10 mg/dL (ref 8.9–10.3)
Chloride: 102 mmol/L (ref 98–111)
Creatinine, Ser: 0.74 mg/dL (ref 0.61–1.24)
GFR, Estimated: >60 mL/min (ref >60)
Glucose, Bld: 111 mg/dL — ABNORMAL HIGH (ref 70–99)
Potassium: 3.8 mmol/L (ref 3.5–5.1)
Sodium: 138 mmol/L (ref 135–145)
Total Bilirubin: 0.8 mg/dL (ref 0.3–1.2)
Total Protein: 7.5 g/dL (ref 6.5–8.1)

## 2022-04-04 LAB — URINALYSIS, ROUTINE W REFLEX MICROSCOPIC
Bilirubin Urine: NEGATIVE
Glucose, UA: NEGATIVE mg/dL
Ketones, ur: 5 mg/dL — AB
Nitrite: NEGATIVE
Protein, ur: NEGATIVE mg/dL
Specific Gravity, Urine: 1.006 (ref 1.005–1.030)
pH: 6 (ref 5.0–8.0)

## 2022-04-04 LAB — HIV ANTIBODY (ROUTINE TESTING W REFLEX): HIV Screen 4th Generation wRfx: NONREACTIVE

## 2022-04-04 LAB — LIPASE, BLOOD: Lipase: 35 U/L (ref 11–51)

## 2022-04-04 LAB — MAGNESIUM: Magnesium: 1.8 mg/dL (ref 1.7–2.4)

## 2022-04-04 MED ORDER — ENOXAPARIN SODIUM 40 MG/0.4ML IJ SOSY
40.0000 mg | PREFILLED_SYRINGE | INTRAMUSCULAR | Status: DC
  Administered 2022-04-04: 40 mg via SUBCUTANEOUS
  Filled 2022-04-04: qty 0.4

## 2022-04-04 MED ORDER — PIPERACILLIN-TAZOBACTAM 3.375 G IVPB
3.3750 g | Freq: Three times a day (TID) | INTRAVENOUS | Status: DC
  Administered 2022-04-04 – 2022-04-05 (×3): 3.375 g via INTRAVENOUS
  Filled 2022-04-04 (×3): qty 50

## 2022-04-04 MED ORDER — LORAZEPAM 2 MG/ML IJ SOLN
1.0000 mg | INTRAMUSCULAR | Status: DC | PRN
  Administered 2022-04-04: 2 mg via INTRAVENOUS
  Filled 2022-04-04: qty 1

## 2022-04-04 MED ORDER — ONDANSETRON HCL 4 MG/2ML IJ SOLN
4.0000 mg | INTRAMUSCULAR | Status: AC
Start: 2022-04-04 — End: 2022-04-04
  Administered 2022-04-04: 4 mg via INTRAVENOUS
  Filled 2022-04-04: qty 2

## 2022-04-04 MED ORDER — NICOTINE 21 MG/24HR TD PT24
21.0000 mg | MEDICATED_PATCH | Freq: Every day | TRANSDERMAL | Status: DC
Start: 2022-04-04 — End: 2022-04-05
  Administered 2022-04-04 – 2022-04-05 (×2): 21 mg via TRANSDERMAL
  Filled 2022-04-04 (×2): qty 1

## 2022-04-04 MED ORDER — LORAZEPAM 1 MG PO TABS: 1.0000 mg | ORAL_TABLET | ORAL | Status: DC | PRN

## 2022-04-04 MED ORDER — ONDANSETRON HCL 4 MG/2ML IJ SOLN
4.0000 mg | Freq: Four times a day (QID) | INTRAMUSCULAR | Status: DC | PRN
  Filled 2022-04-04: qty 2

## 2022-04-04 MED ORDER — ALBUTEROL SULFATE (2.5 MG/3ML) 0.083% IN NEBU: 3.0000 mL | INHALATION_SOLUTION | RESPIRATORY_TRACT | Status: DC | PRN

## 2022-04-04 MED ORDER — NICOTINE 21 MG/24HR TD PT24
21.0000 mg | MEDICATED_PATCH | Freq: Once | TRANSDERMAL | Status: DC
  Administered 2022-04-04: 21 mg via TRANSDERMAL
  Filled 2022-04-04: qty 1

## 2022-04-04 MED ORDER — FOLIC ACID 1 MG PO TABS: 1.0000 mg | ORAL_TABLET | Freq: Every day | ORAL | Status: DC

## 2022-04-04 MED ORDER — ACETAMINOPHEN 325 MG PO TABS
650.0000 mg | ORAL_TABLET | Freq: Four times a day (QID) | ORAL | Status: DC | PRN
Start: 2022-04-04 — End: 2022-04-05
  Administered 2022-04-05: 650 mg via ORAL
  Filled 2022-04-04: qty 2

## 2022-04-04 MED ORDER — MORPHINE SULFATE (PF) 4 MG/ML IV SOLN
4.0000 mg | Freq: Once | INTRAVENOUS | Status: AC
  Administered 2022-04-04: 4 mg via INTRAVENOUS
  Filled 2022-04-04: qty 1

## 2022-04-04 MED ORDER — NICOTINE 21 MG/24HR TD PT24: 21.0000 mg | MEDICATED_PATCH | Freq: Every day | TRANSDERMAL | Status: DC

## 2022-04-04 MED ORDER — THIAMINE HCL 100 MG/ML IJ SOLN
100.0000 mg | Freq: Every day | INTRAMUSCULAR | Status: DC
  Administered 2022-04-04: 100 mg via INTRAVENOUS
  Filled 2022-04-04: qty 2

## 2022-04-04 MED ORDER — THIAMINE HCL 100 MG PO TABS
100.0000 mg | ORAL_TABLET | Freq: Every day | ORAL | Status: DC
  Administered 2022-04-05: 100 mg via ORAL
  Filled 2022-04-04: qty 1

## 2022-04-04 MED ORDER — ACETAMINOPHEN 650 MG RE SUPP: 650.0000 mg | Freq: Four times a day (QID) | RECTAL | Status: DC | PRN

## 2022-04-04 MED ORDER — PHENAZOPYRIDINE HCL 100 MG PO TABS
100.0000 mg | ORAL_TABLET | Freq: Once | ORAL | Status: AC
  Administered 2022-04-04: 100 mg via ORAL
  Filled 2022-04-04: qty 1

## 2022-04-04 MED ORDER — ADULT MULTIVITAMIN W/MINERALS CH
1.0000 | ORAL_TABLET | Freq: Every day | ORAL | Status: DC
  Administered 2022-04-04 – 2022-04-05 (×2): 1 via ORAL
  Filled 2022-04-04 (×2): qty 1

## 2022-04-04 MED ORDER — SODIUM CHLORIDE 0.9 % IV BOLUS
500.0000 mL | Freq: Once | INTRAVENOUS | Status: AC
Start: 2022-04-04 — End: 2022-04-05
  Administered 2022-04-04: 500 mL via INTRAVENOUS

## 2022-04-04 MED ORDER — ONDANSETRON HCL 4 MG PO TABS: 4.0000 mg | ORAL_TABLET | Freq: Four times a day (QID) | ORAL | Status: DC | PRN

## 2022-04-04 MED ORDER — MOMETASONE FURO-FORMOTEROL FUM 100-5 MCG/ACT IN AERO
2.0000 | INHALATION_SPRAY | Freq: Two times a day (BID) | RESPIRATORY_TRACT | Status: DC
  Administered 2022-04-04 – 2022-04-05 (×2): 2 via RESPIRATORY_TRACT
  Filled 2022-04-04: qty 8.8

## 2022-04-04 MED ORDER — FOLIC ACID 1 MG PO TABS
1.0000 mg | ORAL_TABLET | Freq: Every day | ORAL | Status: DC
  Administered 2022-04-04 – 2022-04-05 (×2): 1 mg via ORAL
  Filled 2022-04-04 (×2): qty 1

## 2022-04-04 MED ORDER — THIAMINE HCL 100 MG PO TABS: 100.0000 mg | ORAL_TABLET | Freq: Every day | ORAL | Status: DC

## 2022-04-04 MED ORDER — SODIUM CHLORIDE 0.9 % IV SOLN: INTRAVENOUS | Status: DC

## 2022-04-04 MED ORDER — PIPERACILLIN-TAZOBACTAM 3.375 G IVPB: 3.3750 g | Freq: Three times a day (TID) | INTRAVENOUS | Status: DC

## 2022-04-04 NOTE — Assessment & Plan Note (Signed)
We will request surgery and urology consult during this hospitalization ?

## 2022-04-04 NOTE — Assessment & Plan Note (Signed)
Stable. ?Not acutely exacerbated ?Continue as needed bronchodilator therapy ?Start patient on inhaled steroids ?
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
## 2022-04-04 NOTE — Discharge Instructions (Signed)
You have been diagnosed with a colovesical fistula.  This is an inappropriate connection that has developed between your colon and your bladder.  You are at risk for recurrent urinary tract infections while this fistula is in place, as bacteria from your colon are being transmitted to your bladder and causing infection there. ? ?In order to fix your colovesical fistula, you first need to undergo a cystoscopy and a colonoscopy.  A cystoscopy is a test that looks inside your bladder and a colonoscopy is a test that looks inside your colon.  Both of these tests are important and can help [place] find out why this fistula happened and how we can fix it.  Most likely, you will need surgery in your abdomen to fix your fistula, however we cannot get you scheduled for surgery until you first have your cystoscopy and colonoscopy.  Therefore, it is very important that you keep your appointments for these tests. ?

## 2022-04-04 NOTE — Consult Note (Signed)
Pharmacy Antibiotic Note ? ?Scott Fitzgerald is a 56 y.o. male admitted on 04/04/2022 with  UTI with fistula .  Pharmacy has been consulted for Zosyn dosing. ? ?Plan: ?Zosyn 3.375g IV q8h (4 hour infusion). ? ?Height: '5\' 5"'$  (165.1 cm) ?Weight: 57.6 kg (127 lb) ?IBW/kg (Calculated) : 61.5 ? ?Temp (24hrs), Avg:98.5 ?F (36.9 ?C), Min:98.5 ?F (36.9 ?C), Max:98.5 ?F (36.9 ?C) ? ?Recent Labs  ?Lab 04/04/22 ?0906  ?WBC 8.9  ?CREATININE 0.74  ?  ?Estimated Creatinine Clearance: 83 mL/min (by C-G formula based on SCr of 0.74 mg/dL).   ? ?No Known Allergies ? ?Antimicrobials this admission: ?5/11 Zosyn >>  ? ?Dose adjustments this admission: none ? ? ?Microbiology results: ?5/11 BCx: pending ?5/11 UCx: pending  ? ?Thank you for allowing pharmacy to be a part of this patient?s care. ? ?Kristianne Albin Rodriguez-Guzman PharmD, BCPS ?04/04/2022 10:49 AM ? ? ?

## 2022-04-04 NOTE — Assessment & Plan Note (Signed)
Patient admits to daily alcohol use and complains of feeling very anxious ?We will place on CIWA protocol and administer lorazepam for CIWA score of 8 or greater  ?Place patient on MVI/thiamine/folic acid ?Patient has been advised abstain from further alcohol use ?

## 2022-04-04 NOTE — ED Provider Notes (Signed)
? ?[facility] ?Provider Note ? ? ? Event Date/Time  ? First MD Initiated Contact with Patient 04/04/22 1100   ?  (approximate) ? ? ?History  ? ?Abdominal Pain ? ? ?HPI ? ?Scott Fitzgerald is a 56 y.o. male   who in accordance with discharge summary which I reviewed from March 22, 2022 has a history of urinary tract infection and colovesicular fistula also alcohol abuse and previous sepsis ? ?Patient reports that yesterday started developing symptoms that seem same as when he came to the hospital at the end of April.  Reports he started having discomfort when he urinates a burning sensation with urination noticing a thickening of his urine, and nausea but no vomiting.  Reports good appetite.  Having occasional pain in his very low abdomen, reports it feels like he has developed a urinary tract infection.  He has been taking amoxicillin, and is supposed to follow-up with surgery but has not yet been able to get an outpatient colonoscopy.  Reports he has a tract between his intestines and bladder ? ?No fever.  Reports alcohol use 12 cans of beer daily continues to use alcohol and has not abruptly stopped.  Does get withdrawals if he stops drinking but presently denies having any ? ?Having nausea but no vomiting.  No chest pain or trouble breathing.  Reports shaking chills and burning with urination ? ?  ? ? ?Physical Exam  ? ?Triage Vital Signs: ?ED Triage Vitals [04/04/22 0902]  ?Enc Vitals Group  ?   BP (!) 134/93  ?   Pulse Rate 92  ?   Resp 17  ?   Temp 98.5 ?F (36.9 ?C)  ?   Temp Source Oral  ?   SpO2 100 %  ?   Weight 127 lb (57.6 kg)  ?   Height '5\' 5"'$  (1.651 m)  ?   Head Circumference   ?   Peak Flow   ?   Pain Score 8  ?   Pain Loc   ?   Pain Edu?   ?   Excl. in [place]?   ? ? ?Most recent vital signs: ?Vitals:  ? 04/04/22 0902  ?BP: (!) 134/93  ?Pulse: 92  ?Resp: 17  ?Temp: 98.5 ?F (36.9 ?C)  ?SpO2: 100%  ? ? ? ?General: Awake, no distress.  ?CV:  Good peripheral perfusion.  Normal heart  tones.  Normal rateResp:  Normal effort.  Clear bilateral ?Abd:  No distention.  Reports mild tenderness to palpation of suprapubic region only.  No rebound or guarding in any other area or quadrant.  No pain in remaining abdominal quadrants.  No peritonitis ?Other:  Warm well perfused the nailbeds.  No alteration of mental status.  No shakes or tremulousness ? ? ?ED Results / Procedures / Treatments  ? ?Labs ?(all labs ordered are listed, but only abnormal results are displayed) ?Labs Reviewed  ?COMPREHENSIVE METABOLIC PANEL - Abnormal; Notable for the following components:  ?    Result Value  ? Glucose, Bld 111 (*)   ? All other components within normal limits  ?CBC - Abnormal; Notable for the following components:  ? MCV 101.3 (*)   ? MCH 35.5 (*)   ? All other components within normal limits  ?URINALYSIS, ROUTINE W REFLEX MICROSCOPIC - Abnormal; Notable for the following components:  ? Color, Urine YELLOW (*)   ? APPearance CLOUDY (*)   ? Hgb urine dipstick LARGE (*)   ? Ketones, ur  5 (*)   ? Leukocytes,Ua LARGE (*)   ? Bacteria, UA MANY (*)   ? All other components within normal limits  ?CULTURE, BLOOD (ROUTINE X 2)  ?CULTURE, BLOOD (ROUTINE X 2)  ?URINE CULTURE  ?LIPASE, BLOOD  ? ? ? ?EKG ? ? ? ? ?RADIOLOGY ? ? ? ? ?PROCEDURES: ? ?Critical Care performed: No ? ?Procedures ? ? ?MEDICATIONS ORDERED IN ED: ?Medications  ?LORazepam (ATIVAN) tablet 1-4 mg ( Oral See Alternative 04/04/22 1125)  ?  Or  ?LORazepam (ATIVAN) injection 1-4 mg (2 mg Intravenous Given 04/04/22 1125)  ?thiamine tablet 100 mg ( Oral See Alternative 04/04/22 1054)  ?  Or  ?thiamine (B-1) injection 100 mg (100 mg Intravenous Given 04/04/22 1054)  ?folic acid (FOLVITE) tablet 1 mg (1 mg Oral Given 04/04/22 1126)  ?multivitamin with minerals tablet 1 tablet (1 tablet Oral Given 04/04/22 1126)  ?nicotine (NICODERM CQ - dosed in mg/24 hours) patch 21 mg (21 mg Transdermal Patch Applied 04/04/22 1128)  ?piperacillin-tazobactam (ZOSYN) IVPB 3.375 g (has no  administration in time range)  ?morphine (PF) 4 MG/ML injection 4 mg (4 mg Intravenous Given 04/04/22 1055)  ?ondansetron (ZOFRAN) injection 4 mg (4 mg Intravenous Given 04/04/22 1053)  ?sodium chloride 0.9 % bolus 500 mL (0 mLs Intravenous Stopped 04/04/22 1128)  ? ? ? ?IMPRESSION / MDM / ASSESSMENT AND PLAN / ED COURSE  ?I reviewed the triage vital signs and the nursing notes. ?             ?               ? ?Differential diagnosis includes, but is not limited to, resistant or complex urinary tract infection associated with colovesicular fistula, low probability based on clinical history and exam for diverticulitis perforation appendicitis bowel obstruction or other acute intra-abdominal pathology on the differential.  His clinical symptoms of dysuria, changes in urination, rigors and reports that he had very similar symptoms with previous presentation seem consistent with recurrent or resistant urinary tract infection.  Based on previous cultures we will order Zosyn, he is currently on amoxicillin. ? ?The patient is on the cardiac monitor to evaluate for evidence of arrhythmia and/or significant heart rate changes. ? ?Labs reviewed notable for urine analysis that is consistent with urinary tract infection sent for culture. ? ?Consulted with hospitalist Dr. Francine Graven, after discussion with nursing the patient she will admit for further care and treatment.  I have also discussed the case with general surgery Corrinne Eagle working with Dr. Hampton Abbot.  They advised would be reasonable to obtain consultation from Dr. Peyton Najjar (non-emergent) an urology to see if any additional recommendations at this time.  Will admit to hospitalist, notify Dr. Francine Graven of general surgery's recommendation to consult with Dr. Peyton Najjar and urology during admission ? ?  ? ? ?FINAL CLINICAL IMPRESSION(S) / ED DIAGNOSES  ? ?Final diagnoses:  ?Complicated UTI (urinary tract infection)  ? ? ? ?Rx / DC Orders  ? ?ED Discharge Orders   ? ? None  ? ?   ? ? ? ?Note:  This document was prepared using Dragon voice recognition software and may include unintentional dictation errors. ?  Delman Kitten, MD ?04/04/22 1136 ? ?

## 2022-04-04 NOTE — Assessment & Plan Note (Signed)
Smoking cessation has been discussed with patient in detail We will place patient on a nicotine transdermal patch 21 mg daily 

## 2022-04-04 NOTE — Assessment & Plan Note (Signed)
Patient has a colovesical fistula and was recently admitted to the hospital for UTI ?Urine culture at that time yielded E. coli and Enterobacter cloacae both sensitive to Zosyn ?Continue Zosyn adjusted to patient's renal function ?Follow-up results of urine culture ?

## 2022-04-04 NOTE — ED Triage Notes (Signed)
Pt states "I have a hole in my bladder where the poops leaks into my urine" c/o having abd pain with nausea and had with shakes all over since yesterday ?
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
## 2022-04-04 NOTE — Plan of Care (Signed)

## 2022-04-05 ENCOUNTER — Other Ambulatory Visit: Payer: Self-pay

## 2022-04-05 ENCOUNTER — Ambulatory Visit: Payer: Self-pay | Admitting: Family Medicine

## 2022-04-05 ENCOUNTER — Telehealth: Payer: Self-pay | Admitting: *Deleted

## 2022-04-05 DIAGNOSIS — J439 Emphysema, unspecified: Secondary | ICD-10-CM

## 2022-04-05 DIAGNOSIS — N321 Vesicointestinal fistula: Secondary | ICD-10-CM

## 2022-04-05 DIAGNOSIS — F1010 Alcohol abuse, uncomplicated: Secondary | ICD-10-CM

## 2022-04-05 LAB — URINE CULTURE

## 2022-04-05 LAB — BASIC METABOLIC PANEL
Anion gap: 6 (ref 5–15)
BUN: 8 mg/dL (ref 6–20)
CO2: 24 mmol/L (ref 22–32)
Calcium: 8.8 mg/dL — ABNORMAL LOW (ref 8.9–10.3)
Chloride: 110 mmol/L (ref 98–111)
Creatinine, Ser: 0.71 mg/dL (ref 0.61–1.24)
GFR, Estimated: >60 mL/min (ref >60)
Glucose, Bld: 132 mg/dL — ABNORMAL HIGH (ref 70–99)
Potassium: 3.3 mmol/L — ABNORMAL LOW (ref 3.5–5.1)
Sodium: 140 mmol/L (ref 135–145)

## 2022-04-05 LAB — CBC
HCT: 38.9 % — ABNORMAL LOW (ref 39.0–52.0)
Hemoglobin: 13.4 g/dL (ref 13.0–17.0)
MCH: 35.2 pg — ABNORMAL HIGH (ref 26.0–34.0)
MCHC: 34.4 g/dL (ref 30.0–36.0)
MCV: 102.1 fL — ABNORMAL HIGH (ref 80.0–100.0)
Platelets: 240 10*3/uL (ref 150–400)
RBC: 3.81 MIL/uL — ABNORMAL LOW (ref 4.22–5.81)
RDW: 12.6 % (ref 11.5–15.5)
WBC: 7.8 10*3/uL (ref 4.0–10.5)
nRBC: 0 % (ref 0.0–0.2)

## 2022-04-05 MED ORDER — ONDANSETRON HCL 4 MG PO TABS
4.0000 mg | ORAL_TABLET | Freq: Three times a day (TID) | ORAL | 0 refills | Status: DC | PRN
  Filled 2022-04-05: qty 26, 9d supply, fill #0

## 2022-04-05 MED ORDER — CEPHALEXIN 500 MG PO CAPS
500.0000 mg | ORAL_CAPSULE | Freq: Three times a day (TID) | ORAL | 0 refills | Status: AC
Start: 2022-04-05 — End: 2022-04-15
  Filled 2022-04-05: qty 30, 10d supply, fill #0

## 2022-04-05 MED ORDER — PHENAZOPYRIDINE HCL 100 MG PO TABS
100.0000 mg | ORAL_TABLET | Freq: Three times a day (TID) | ORAL | Status: DC
  Filled 2022-04-05 (×2): qty 1

## 2022-04-05 MED ORDER — SULFAMETHOXAZOLE-TRIMETHOPRIM 800-160 MG PO TABS
1.0000 | ORAL_TABLET | Freq: Two times a day (BID) | ORAL | 0 refills | Status: AC
  Filled 2022-04-05: qty 20, 10d supply, fill #0
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
## 2022-04-05 NOTE — Telephone Encounter (Signed)
Pt being discharged 04-05-22 scheduled with Peter Congo 04-09-22 please make sure toc is done Monday  ?
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
## 2022-04-05 NOTE — Progress Notes (Signed)
Discharge orders reviewed with the patient. Nurse verbalized the importance of the patient taking all of his antibiotics. Patient sent out via wheelchair with belongings ?
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
## 2022-04-07 NOTE — Discharge Summary (Signed)
?Physician Discharge Summary ?  ?Patient: Scott Fitzgerald MRN: [id number] DOB: January 27, 1966  ?Admit date:     04/04/2022  ?Discharge date: 04/05/2022  ?Discharge Physician: Max Sane  ? ?PCP: Noreene Larsson, NP  ? ?Recommendations at discharge:  ? ? Follow-up with outpatient providers as requested ?He is scheduled to have colonoscopy on 04/10/2022 with Dr. Lysle Pearl ?He will be seen by surgeon Dr. Lesli Albee for preop clearance and discussion on 04/09/2022 ?He is requested to follow-up with Dr. Bernardo Heater on 04/11/2022 at 57 AM for cystoscopy. ? ?Discharge Diagnoses: ?Principal Problem: ?  Complicated UTI (urinary tract infection) ?Active Problems: ?  Colovesical fistula ?  Alcohol abuse ?  Emphysema of lung ([place]) ?  Current every day smoker ? ?Hospital Course: ?Assessment and Plan: ?* Complicated UTI (urinary tract infection) ?Patient has a colovesical fistula and was recently admitted to the hospital for UTI ?Urine culture at that time yielded E. coli and Enterobacter cloacae both sensitive to Zosyn ?Urine culture on this admission showed mixed organisms. ?Being discharged on oral antibiotic as surgery and urology planning for outpatient fistula repair ? ?Colovesical fistula ?surgery and urology evaluated the patient during this admission and recommend outpatient follow-up ? ?Alcohol abuse ?Counseled ? ?Emphysema of lung ([place]) ?Stable. ? ?Current every day smoker ?Counseled  ? ? ? ? ?  ? ? ?Consultants: Surgery, urology ?Disposition: Home ?Diet recommendation:  ?Discharge Diet Orders (From admission, onward)  ? ?  Start     Ordered  ? 04/05/22 0000  Diet - low sodium heart healthy       ? 04/05/22 5361  ? ?  ?  ? ?  ? ?Carb modified diet ?DISCHARGE MEDICATION: ?Allergies as of 04/05/2022   ?No Known Allergies ?  ? ?  ?Medication List  ?  ? ?STOP taking these medications   ? ?folic acid 1 MG tablet ?Commonly known as: FOLVITE ?  ?thiamine 100 MG tablet ?  ? ?  ? ?TAKE these medications   ? ?albuterol (2.5 MG/3ML) 0.083%  nebulizer solution ?Commonly known as: PROVENTIL ?Take 3 mLs (2.5 mg total) by nebulization every 6 (six) hours as needed for wheezing or shortness of breath. ?  ?albuterol 108 (90 Base) MCG/ACT inhaler ?Commonly known as: VENTOLIN HFA ?Inhale 2 puffs into the lungs every 4 (four) hours as needed for wheezing or shortness of breath. ?  ?cephALEXin 500 MG capsule ?Commonly known as: KEFLEX ?Take 1 capsule (500 mg total) by mouth 3 (three) times daily for 10 days. ?  ?nicotine 21 mg/24hr patch ?Commonly known as: NICODERM CQ - dosed in mg/24 hours ?Place 1 patch (21 mg total) onto the skin daily. ?  ?ondansetron 4 MG tablet ?Commonly known as: Zofran ?Take 1 tablet (4 mg total) by mouth every 8 (eight) hours as needed for nausea or vomiting. ?  ?sulfamethoxazole-trimethoprim 800-160 MG tablet ?Commonly known as: BACTRIM DS ?Take 1 tablet by mouth 2 (two) times daily for 10 days. ?  ? ?  ? ? Follow-up Information   ? ? Noreene Larsson, NP. Go on 04/09/2022.   ?Specialty: Nurse Practitioner ?Why: [facility] Discharge F/UP 3:20 PM ?Contact information: ?[address]  ?[address] ?[place] [address] ?[phone] ? ? ?  ?  ? ? Abbie Sons, MD. Daphane Shepherd on 04/11/2022.   ?Specialty: Urology ?Why: For cystoscopy 11:00 a.m. ?Contact information: ?[address] ?[address] ?[place] [address] ?[phone] ? ? ?  ?  ? ? Lysle Pearl, Isami, DO  Follow up on 04/10/2022.   ?Specialty: Surgery ?Why: For Colonoscopy and the Endocopy unit. Will call with more details about time or arrival and preparation for colonoscopy ?Contact information: ?[address] ?[place] [address] ?[phone] ? ? ?  ?  ? ? Herbert Pun, MD Follow up on 04/09/2022.   ?Specialty: General Surgery ?Why: For discussion of surgery for the fistula in the colon. ?Contact information: ?[address] ?[place] [address] ?[phone] ? ? ?  ?  ? ?  ?  ? ?  ? ?Discharge Exam: ?Danley Danker Weights  ? 04/04/22 0902  ?Weight: 57.6 kg   ? ?Constitutional:   ?   Appearance: He is well-developed.  ?HENT:  ?   Head: Normocephalic and atraumatic.  ?   Mouth/Throat:  ?   Mouth: Mucous membranes are moist.  ?Eyes:  ?   Extraocular Movements: Extraocular movements intact.  ?Cardiovascular:  ?   Rate and Rhythm: Normal rate and regular rhythm.  ?Pulmonary:  ?   Effort: Pulmonary effort is normal.  ?   Breath sounds: Normal breath sounds.  ?Abdominal:  ?   Soft, benign ?Skin: ?   General: Skin is warm and dry.  ?Neurological:  ?   General: No focal deficit present.  ?   Mental Status: He is alert.  ?Psychiatric:     ?   Mood and Affect: Mood is anxious.     ?   Behavior: Behavior normal.  ?  ? ?Condition at discharge: fair ? ?The results of significant diagnostics from this hospitalization (including imaging, microbiology, ancillary and laboratory) are listed below for reference.  ? ?Imaging Studies: ?DG Chest 2 View ? ?Result Date: 03/20/2022 ?CLINICAL DATA:  Shortness of breath. Reduced oral intake/appetite. Chills. EXAM: CHEST - 2 VIEW COMPARISON:  01/19/2021 FINDINGS: Emphysema.  Mild atherosclerotic calcification of the aortic arch. The lungs appear otherwise clear. No blunting of the costophrenic angles. Mild lower thoracic spondylosis. Heart size within normal limits. No significant abnormal bony findings. IMPRESSION: 1. No acute findings. 2. Aortic Atherosclerosis (ICD10-I70.0) and Emphysema (ICD10-J43.9). Electronically Signed   By: Van Clines M.D.   On: 03/20/2022 16:17  ? ?CT Angio Chest PE W and/or Wo Contrast ? ?Result Date: 03/20/2022 ?CLINICAL DATA:  Sepsis EXAM: CT ANGIOGRAPHY CHEST CT ABDOMEN AND PELVIS WITH CONTRAST TECHNIQUE: Multidetector CT imaging of the chest was performed using the standard protocol during bolus administration of intravenous contrast. Multiplanar CT image reconstructions and MIPs were obtained to evaluate the vascular anatomy. Multidetector CT imaging of the abdomen and pelvis was performed using the standard  protocol during bolus administration of intravenous contrast. RADIATION DOSE REDUCTION: This exam was performed according to the departmental dose-optimization program which includes automated exposure control, adjustment of the mA and/or kV according to patient size and/or use of iterative reconstruction technique. CONTRAST:  153m OMNIPAQUE IOHEXOL 350 MG/ML SOLN COMPARISON:  Chest x-ray 03/20/2022 FINDINGS: CTA CHEST FINDINGS Cardiovascular: Satisfactory opacification of the pulmonary arteries to the segmental level. No evidence of pulmonary embolism. Normal heart size. No pericardial effusion. Nonaneurysmal aorta. Mild atherosclerosis. No dissection. Mediastinum/Nodes: No enlarged mediastinal, hilar, or axillary lymph nodes. Thyroid gland, trachea, and esophagus demonstrate no significant findings. Lungs/Pleura: Emphysema. No acute airspace disease, pleural effusion or pneumothorax Musculoskeletal: No chest wall abnormality. No acute or significant osseous findings. Review of the MIP images confirms the above findings. CT ABDOMEN and PELVIS FINDINGS Hepatobiliary: No focal liver abnormality is seen. No gallstones, gallbladder wall thickening, or biliary dilatation. Pancreas: Unremarkable. No  pancreatic ductal dilatation or surrounding inflammatory changes. Spleen: Normal in size without focal abnormality. Adrenals/Urinary Tract: Adrenal glands are normal. Kidneys show no hydronephrosis. Moderate air within the urinary bladder. Wall thickening of the posterior aspect of the bladder. Stomach/Bowel: Stomach nonenlarged. No dilated small bowel. Negative appendix. Sigmoid colon diverticular disease. Thickened appearance of the sigmoid colon with soft tissue tract containing gas and fluid extending from the sigmoid colon to the posterior aspect of the bladder, series 2, image 68 and sagittal series 6, image 58, consistent with a colovesical fistula. Vascular/Lymphatic: Moderate aortic atherosclerosis. No aneurysm. No  suspicious lymph nodes. Reproductive: Prostate is unremarkable. Other: Negative for pelvic effusion or free air. Musculoskeletal: No acute or significant osseous findings. Review of the MIP images confirms th

## 2022-04-08 ENCOUNTER — Telehealth: Payer: Self-pay | Admitting: Family Medicine

## 2022-04-08 ENCOUNTER — Telehealth: Payer: Self-pay | Admitting: *Deleted

## 2022-04-08 ENCOUNTER — Telehealth: Payer: Self-pay

## 2022-04-08 NOTE — Telephone Encounter (Signed)
Pt toc made appt 04-09-22 ?

## 2022-04-08 NOTE — Telephone Encounter (Signed)
1st attempt LVM

## 2022-04-08 NOTE — Telephone Encounter (Signed)
Appt 04-09-22 not today call was made ?

## 2022-04-08 NOTE — Telephone Encounter (Signed)
Transition Care Management Unsuccessful Follow-up Telephone Call ? ?Date of discharge and from where:  04-05-22  ? ?Attempts:  1st Attempt ? ?Reason for unsuccessful TCM follow-up call:  Left voice message ? ? ? ?

## 2022-04-08 NOTE — Telephone Encounter (Signed)
Pt returning call for TOC, appt is today at 3:20pm ?

## 2022-04-08 NOTE — Telephone Encounter (Signed)
Transition Care Management Follow-up Telephone Call ?Date of discharge and from where: 04-05-22 [facility]  ?How have you been since you were released from the hospital? Pt is very nauseated  ?Any questions or concerns? Yes ? ?Items Reviewed: ?Did the pt receive and understand the discharge instructions provided? Yes  ?Medications obtained and verified? Yes  ?Other? No  ?Any new allergies since your discharge? No  ?Dietary orders reviewed? Yes ?Do you have support at home? Yes  ? ?Home Care and Equipment/Supplies: ?Were home health services ordered? no ?If so, what is the name of the agency? NA  ?Has the agency set up a time to come to the patient's home? not applicable ?Were any new equipment or medical supplies ordered?  No ?What is the name of the medical supply agency? NA ?Were you able to get the supplies/equipment? not applicable ?Do you have any questions related to the use of the equipment or supplies? No ? ?Functional Questionnaire: (I = Independent and D = Dependent) ?ADLs: i ? ?Bathing/Dressing- i ? ?Meal Prep- i ? ?Eating- i ? ?Maintaining continence- i ? ?Transferring/Ambulation- i ? ?Managing Meds- i ? ?Follow up appointments reviewed: ? ?PCP Hospital f/u appt confirmed? Yes  Scheduled to see 04-09-22 on [place] @ 3:20. ?[facility] f/u appt confirmed? Yes   ?Are transportation arrangements needed? No  ?If their condition worsens, is the pt aware to call PCP or go to the Emergency Dept.? Yes ?Was the patient provided with contact information for the PCP's office or ED? Yes ?Was to pt encouraged to call back with questions or concerns? Yes ? ?

## 2022-04-09 ENCOUNTER — Encounter: Payer: Self-pay | Admitting: Family Medicine

## 2022-04-09 ENCOUNTER — Ambulatory Visit (INDEPENDENT_AMBULATORY_CARE_PROVIDER_SITE_OTHER): Payer: Self-pay | Admitting: Family Medicine

## 2022-04-09 VITALS — BP 102/68 | HR 75 | Ht 65.0 in | Wt 124.4 lb

## 2022-04-09 DIAGNOSIS — N390 Urinary tract infection, site not specified: Secondary | ICD-10-CM

## 2022-04-09 DIAGNOSIS — N321 Vesicointestinal fistula: Secondary | ICD-10-CM

## 2022-04-09 DIAGNOSIS — Z720 Tobacco use: Secondary | ICD-10-CM

## 2022-04-09 DIAGNOSIS — R110 Nausea: Secondary | ICD-10-CM

## 2022-04-09 DIAGNOSIS — F1011 Alcohol abuse, in remission: Secondary | ICD-10-CM

## 2022-04-09 MED ORDER — ONDANSETRON HCL 4 MG PO TABS: 4.0000 mg | ORAL_TABLET | Freq: Three times a day (TID) | ORAL | 0 refills | Status: DC | PRN

## 2022-04-09 MED ORDER — NICOTINE 21 MG/24HR TD PT24: 21.0000 mg | MEDICATED_PATCH | Freq: Every day | TRANSDERMAL | 0 refills | Status: DC
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
## 2022-04-09 NOTE — Patient Instructions (Addendum)
I appreciate the opportunity to provide care to you today! ? ?Follow-up: 1 month ?  ? ? ?Refills: Zofran and Nicotine patch ? ?  ?Please continue to a heart-healthy diet and increase your physical activities. Try to exercise for 56mns at least three times a week.  ? ? ?  ?It was a pleasure to see you and I look forward to continuing to work together on your health and well-being. ?Please do not hesitate to call the office if you need care or have questions about your care. ?  ?Have a wonderful day and week. ?With Gratitude, ?GAlvira MondayMSN, FNP-BC ? ?

## 2022-04-10 ENCOUNTER — Ambulatory Visit
Admission: RE | Admit: 2022-04-10 | Discharge: 2022-04-10 | Disposition: A | Discharge status: Home / Self Care | Payer: Self-pay | Attending: Surgery | Admitting: Surgery

## 2022-04-10 ENCOUNTER — Ambulatory Visit: Payer: Self-pay | Admitting: Certified Registered"

## 2022-04-10 ENCOUNTER — Encounter
Admission: RE | Disposition: A | Discharge status: Home / Self Care | Payer: Self-pay | Source: Home / Self Care | Attending: Surgery

## 2022-04-10 DIAGNOSIS — K5730 Diverticulosis of large intestine without perforation or abscess without bleeding: Secondary | ICD-10-CM | POA: Insufficient documentation

## 2022-04-10 DIAGNOSIS — K640 First degree hemorrhoids: Secondary | ICD-10-CM | POA: Insufficient documentation

## 2022-04-10 DIAGNOSIS — D124 Benign neoplasm of descending colon: Secondary | ICD-10-CM | POA: Insufficient documentation

## 2022-04-10 DIAGNOSIS — K644 Residual hemorrhoidal skin tags: Secondary | ICD-10-CM | POA: Insufficient documentation

## 2022-04-10 DIAGNOSIS — D128 Benign neoplasm of rectum: Secondary | ICD-10-CM | POA: Insufficient documentation

## 2022-04-10 DIAGNOSIS — K56699 Other intestinal obstruction unspecified as to partial versus complete obstruction: Secondary | ICD-10-CM | POA: Insufficient documentation

## 2022-04-10 DIAGNOSIS — Z720 Tobacco use: Secondary | ICD-10-CM | POA: Insufficient documentation

## 2022-04-10 DIAGNOSIS — F17210 Nicotine dependence, cigarettes, uncomplicated: Secondary | ICD-10-CM | POA: Insufficient documentation

## 2022-04-10 HISTORY — PX: COLONOSCOPY WITH PROPOFOL: SHX5780

## 2022-04-10 SURGERY — COLONOSCOPY WITH PROPOFOL
Anesthesia: General

## 2022-04-10 MED ORDER — LIDOCAINE HCL (CARDIAC) PF 100 MG/5ML IV SOSY
PREFILLED_SYRINGE | INTRAVENOUS | Status: DC | PRN
  Administered 2022-04-10: 50 mg via INTRAVENOUS

## 2022-04-10 MED ORDER — MIDAZOLAM HCL 2 MG/2ML IJ SOLN
INTRAMUSCULAR | Status: DC | PRN
Start: 2022-04-10 — End: 2022-04-10
  Administered 2022-04-10: 2 mg via INTRAVENOUS

## 2022-04-10 MED ORDER — PROPOFOL 500 MG/50ML IV EMUL
INTRAVENOUS | Status: AC
  Filled 2022-04-10: qty 50

## 2022-04-10 MED ORDER — SODIUM CHLORIDE 0.9 % IV SOLN
INTRAVENOUS | Status: DC
  Administered 2022-04-10: 20 mL/h via INTRAVENOUS

## 2022-04-10 MED ORDER — PROPOFOL 10 MG/ML IV BOLUS
INTRAVENOUS | Status: AC
  Filled 2022-04-10: qty 20

## 2022-04-10 MED ORDER — MIDAZOLAM HCL 2 MG/2ML IJ SOLN
INTRAMUSCULAR | Status: AC
  Filled 2022-04-10: qty 2

## 2022-04-10 MED ORDER — DEXMEDETOMIDINE (PRECEDEX) IN NS 20 MCG/5ML (4 MCG/ML) IV SYRINGE
PREFILLED_SYRINGE | INTRAVENOUS | Status: DC | PRN
  Administered 2022-04-10: 8 ug via INTRAVENOUS

## 2022-04-10 MED ORDER — PROPOFOL 500 MG/50ML IV EMUL
INTRAVENOUS | Status: DC | PRN
  Administered 2022-04-10: 150 ug/kg/min via INTRAVENOUS

## 2022-04-10 MED ORDER — PROPOFOL 10 MG/ML IV BOLUS
INTRAVENOUS | Status: DC | PRN
  Administered 2022-04-10: 60 mg via INTRAVENOUS
  Administered 2022-04-10: 20 mg via INTRAVENOUS
  Administered 2022-04-10: 30 mg via INTRAVENOUS
  Administered 2022-04-10 (×2): 40 mg via INTRAVENOUS
  Administered 2022-04-10: 20 mg via INTRAVENOUS

## 2022-04-10 MED ORDER — GLYCOPYRROLATE 0.2 MG/ML IJ SOLN
INTRAMUSCULAR | Status: AC
  Filled 2022-04-10: qty 2

## 2022-04-10 NOTE — Assessment & Plan Note (Signed)
-  Reports that he have been feeling very nauseous  ?-zofran ordered ?
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
## 2022-04-10 NOTE — Assessment & Plan Note (Signed)
Labs and imaging reviewed ?Pt is compliant with prescribed antibiotics and encouraged to continue treatment regimen ?Following up with Dr. Bernardo Heater on 04/11/22 for cystoscopy  ?  ?

## 2022-04-10 NOTE — Op Note (Signed)
[facility] ?Gastroenterology ?Patient Name: Scott Fitzgerald ?Procedure Date: 04/10/2022 10:04 AM ?MRN: [id number] ?Account #: [id number] ?Date of Birth: 08-04-1966 ?Admit Type: Outpatient ?Age: 56 ?Room: [facility] ENDO ROOM 1 ?Gender: Male ?Note Status: Finalized ?Instrument Name: Colonscope [id number] ?Procedure:             Colonoscopy ?Indications:           Diverticula ?Providers:             Eliseo Squires MD, MD ?Medicines:             Propofol per Anesthesia ?Complications:         No immediate complications. ?Procedure:             Pre-Anesthesia Assessment: ?                       - After reviewing the risks and benefits, the patient  ?                       was deemed in satisfactory condition to undergo the  ?                       procedure in an ambulatory setting. ?                       After obtaining informed consent, the colonoscope was  ?                       passed under direct vision. Throughout the procedure,  ?                       the patient's blood pressure, pulse, and oxygen  ?                       saturations were monitored continuously. The  ?                       Colonoscope was introduced through the anus and  ?                       advanced to the the cecum, identified by the ileocecal  ?                       valve. The colonoscopy was somewhat difficult due to  ?                       multiple diverticula in the colon, bowel stenosis and  ?                       inadequate bowel prep. Successful completion of the  ?                       procedure was aided by lavage. The patient tolerated  ?                       the procedure well. The quality of the bowel  ?                       preparation was fair. ?Findings: ?     Skin tags  were found on perianal exam. ?     Three sessile polyps were found in the distal rectum and proximal  ?     descending colon. The polyps were 2 to 4 mm in size. These were biopsied  ?     with a cold forceps for histology. Estimated blood loss  was minimal. ?     Many small and large-mouthed diverticula were found in the sigmoid colon. ?     A benign-appearing, intrinsic moderate stenosis measuring 4 cm (in  ?     length) was found in the sigmoid colon and was traversed. likely from  ?     recent diveriticulitis episode ?     no obvious colovesicular fistula visualized in area ?     Non-bleeding internal hemorrhoids were found during retroflexion. The  ?     hemorrhoids were Grade I (internal hemorrhoids that do not prolapse). ?Impression:            - Preparation of the colon was fair. ?                       - Perianal skin tags found on perianal exam. ?                       - Three 2 to 4 mm polyps in the distal rectum and in  ?                       the proximal descending colon. Biopsied. ?                       - Diverticulosis in the sigmoid colon. ?                       - Stricture in the sigmoid colon. ?                       - Non-bleeding internal hemorrhoids. ?Recommendation:        - Await pathology results. ?                       - Written discharge instructions were provided to the  ?                       patient. ?                       - Discharge patient to home. ?                       - Regular diet until NPO at midnight for the  ?                       cystoscopy procedure tomorrow. ?Procedure Code(s):     --- Professional --- ?                       [phone], Colonoscopy, flexible; with biopsy, single or  ?                       multiple ?Diagnosis Code(s):     --- Professional --- ?  K64.0, First degree hemorrhoids ?                       K57.30, Diverticulosis of large intestine without  ?                       perforation or abscess without bleeding ?                       K62.1, Rectal polyp ?                       K63.5, Polyp of colon ?                       K56.699, Other intestinal obstruction unspecified as  ?                       to partial versus complete obstruction ?                       K64.4,  Residual hemorrhoidal skin tags ?CPT copyright 2019 American Medical Association. All rights reserved. ?The codes documented in this report are preliminary and upon coder review may  ?be revised to meet current compliance requirements. ?Dr. Sheppard Penton, MD ?Eliseo Squires MD, MD ?04/10/2022 11:12:42 AM ?This report has been signed electronically. ?Number of Addenda: 0 ?Note Initiated On: 04/10/2022 10:04 AM ?Scope Withdrawal Time: 0 hours 20 minutes 44 seconds  ?Total Procedure Duration: 0 hours 42 minutes 27 seconds  ?Estimated Blood Loss:  Estimated blood loss was minimal. ?     [facility] ?

## 2022-04-10 NOTE — Assessment & Plan Note (Signed)
-   cystoscopy is scheduled on 04/11/22 at 11 am with Dr. Bernardo Heater. ?

## 2022-04-10 NOTE — Interval H&P Note (Signed)
History and Physical Interval Note: ? ?04/10/2022 ?9:53 AM ? ?Scott Fitzgerald  has presented today for surgery, with the diagnosis of COLOVESICAL FISTULA.  The various methods of treatment have been discussed with the patient and family. After consideration of risks, benefits and other options for treatment, the patient has consented to  Procedure(s) with comments: ?COLONOSCOPY WITH PROPOFOL (N/A) - BYRNETT OK'D PER OFFICE as a surgical intervention.  The patient's history has been reviewed, patient examined, no change in status, stable for surgery.  I have reviewed the patient's chart and labs.  Questions were answered to the patient's satisfaction.   ? ? ?Zoya Sprecher Lysle Pearl ? ? ?

## 2022-04-10 NOTE — Assessment & Plan Note (Signed)
-  He shares drinking 12 packs of alcohol daily ?-Advise him to cut back on his alcohol intake ?-Informed that excessive drinking is likely the reason for his poor oral intake, headaches, and feeling like he is shaking from the inside  ?-Encouraged to cut back on his alcohol intake ?

## 2022-04-10 NOTE — Anesthesia Procedure Notes (Signed)
Procedure Name: [place] ?Date/Time: 04/10/2022 10:22 AM ?Performed by: Jerrye Noble, CRNA ?Pre-anesthesia Checklist: Patient identified, Emergency Drugs available, Suction available and Patient being monitored ?Patient Re-evaluated:Patient Re-evaluated prior to induction ?Oxygen Delivery Method: Nasal cannula ? ? ? ? ?

## 2022-04-10 NOTE — Transfer of Care (Signed)
Immediate Anesthesia Transfer of Care Note ? ?Patient: Scott Fitzgerald ? ?Procedure(s) Performed: COLONOSCOPY WITH PROPOFOL ? ?Patient Location: PACU and Endoscopy Unit ? ?Anesthesia Type:General ? ?Level of Consciousness: awake, drowsy and patient cooperative ? ?Airway & Oxygen Therapy: Patient Spontanous Breathing ? ?Post-op Assessment: Report given to RN and Post -op Vital signs reviewed and stable ? ?Post vital signs: Reviewed and stable ? ?Last Vitals:  ?Vitals Value Taken Time  ?BP 103/60 04/10/22 1104  ?Temp    ?Pulse 66 04/10/22 1104  ?Resp 16 04/10/22 1104  ?SpO2 98   ? ? ?Last Pain:  ?Vitals:  ? 04/10/22 1104  ?TempSrc: Temporal  ?PainSc:   ?   ? ?  ? ?Complications: No notable events documented. ?

## 2022-04-10 NOTE — Assessment & Plan Note (Signed)
smokes 2 1/2 packs of cigarettes daily. ? He reports that he has been smoking since he was 56 years old.  ?Smokes about 2 1/2 pack/day ? ?Asked about quitting: confirms that he/she currently smokes cigarettes ?Advise to quit smoking: Educated about QUITTING to reduce the risk of cancer, cardio and cerebrovascular disease. ?Assess willingness: Unwilling to quit at this time, but is working on cutting back. ?Assist with counseling and pharmacotherapy: Counseled for 5 minutes and literature provided. ?Arrange for follow up: follow up in 3 months and continue to offer help. ?

## 2022-04-11 ENCOUNTER — Encounter: Payer: Self-pay | Admitting: Urology

## 2022-04-11 ENCOUNTER — Ambulatory Visit (INDEPENDENT_AMBULATORY_CARE_PROVIDER_SITE_OTHER): Payer: Self-pay | Admitting: Urology

## 2022-04-11 VITALS — BP 132/81 | HR 75 | Ht 65.0 in | Wt 121.9 lb

## 2022-04-11 DIAGNOSIS — N321 Vesicointestinal fistula: Secondary | ICD-10-CM

## 2022-04-11 DIAGNOSIS — N390 Urinary tract infection, site not specified: Secondary | ICD-10-CM

## 2022-04-11 LAB — CULTURE, BLOOD (ROUTINE X 2)
Culture: NO GROWTH
Culture: NO GROWTH
Special Requests: ADEQUATE
Special Requests: ADEQUATE

## 2022-04-11 LAB — MICROSCOPIC EXAMINATION

## 2022-04-11 LAB — URINALYSIS, COMPLETE
Bilirubin, UA: NEGATIVE
Glucose, UA: NEGATIVE
Nitrite, UA: POSITIVE — AB
Protein,UA: NEGATIVE
Specific Gravity, UA: >1.03 — ABNORMAL HIGH (ref 1.005–1.030)
Urobilinogen, Ur: 0.2 mg/dL (ref 0.2–1.0)
pH, UA: 6 (ref 5.0–7.5)

## 2022-04-11 LAB — SURGICAL PATHOLOGY

## 2022-04-11 NOTE — Anesthesia Postprocedure Evaluation (Signed)
Anesthesia Post Note  Patient: Scott Fitzgerald  Procedure(s) Performed: COLONOSCOPY WITH PROPOFOL  Patient location during evaluation: PACU Anesthesia Type: General Level of consciousness: awake and alert Pain management: pain level controlled Vital Signs Assessment: post-procedure vital signs reviewed and stable Respiratory status: spontaneous breathing, nonlabored ventilation, respiratory function stable and patient connected to nasal cannula oxygen Cardiovascular status: blood pressure returned to baseline and stable Postop Assessment: no apparent nausea or vomiting Anesthetic complications: no   No notable events documented.   Last Vitals:  Vitals:   04/10/22 1104 04/10/22 1120  BP: 103/60 (!) 116/103  Pulse: 66 66  Resp: 16 18  Temp:    SpO2: 99% 98%    Last Pain:  Vitals:   04/10/22 1120  TempSrc:   PainSc: 0-No pain                 Molli Barrows

## 2022-04-11 NOTE — Progress Notes (Signed)
   04/11/22  CC:  Chief Complaint  Patient presents with   Cysto    HPI: 56 y.o. male with a history of urosepsis and CT abdomen pelvis consistent with a colovesical fistula.  Colonoscopy yesterday showed no definite evidence of the fistula or malignancy.  Blood pressure 132/81, pulse 75, height '5\' 5"'$  (1.651 m), weight 121 lb 14.4 oz (55.3 kg). NED. A&Ox3.   No respiratory distress   Abd soft, NT, ND Normal phallus with bilateral descended testicles  Cystoscopy Procedure Note  Patient identification was confirmed, informed consent was obtained, and patient was prepped using Betadine solution.  Lidocaine jelly was administered per urethral meatus.     Pre-Procedure: - Inspection reveals a normal caliber urethral meatus.  Procedure: The flexible cystoscope was introduced without difficulty - No urethral strictures/lesions are present. -  Moderate lateral lobe enlargement  prostate  - Mild elevation bladder neck - Bilateral ureteral orifices identified - Bladder mucosa  reveals no solid or papillary lesions.  On the left    posterior wall towards the bladder base is a dimpled area of mucosa with inflammatory tissue - No bladder stones - Mild trabeculation  Retroflexion shows no lesions   Post-Procedure: - Patient tolerated the procedure well  Assessment/ Plan: The area noted on cystoscopy corresponds to the region consistent with the fistula on CT Keep scheduled follow-up with general surgery No evidence of malignancy    Abbie Sons, MD

## 2022-04-12 ENCOUNTER — Encounter: Payer: Self-pay | Admitting: Surgery

## 2022-04-15 NOTE — Telephone Encounter (Signed)
Error

## 2022-04-16 ENCOUNTER — Emergency Department (HOSPITAL_COMMUNITY)
Admission: EM | Admit: 2022-04-16 | Discharge: 2022-04-16 | Disposition: A | Discharge status: Home / Self Care | Payer: Self-pay | Attending: Emergency Medicine | Admitting: Emergency Medicine

## 2022-04-16 ENCOUNTER — Other Ambulatory Visit: Payer: Self-pay

## 2022-04-16 ENCOUNTER — Encounter (HOSPITAL_COMMUNITY): Payer: Self-pay | Admitting: Emergency Medicine

## 2022-04-16 DIAGNOSIS — N3000 Acute cystitis without hematuria: Secondary | ICD-10-CM | POA: Insufficient documentation

## 2022-04-16 DIAGNOSIS — R110 Nausea: Secondary | ICD-10-CM | POA: Insufficient documentation

## 2022-04-16 DIAGNOSIS — N321 Vesicointestinal fistula: Secondary | ICD-10-CM | POA: Insufficient documentation

## 2022-04-16 DIAGNOSIS — D72829 Elevated white blood cell count, unspecified: Secondary | ICD-10-CM | POA: Insufficient documentation

## 2022-04-16 LAB — URINALYSIS, ROUTINE W REFLEX MICROSCOPIC
Bilirubin Urine: NEGATIVE
Glucose, UA: NEGATIVE mg/dL
Ketones, ur: NEGATIVE mg/dL
Nitrite: NEGATIVE
Protein, ur: 30 mg/dL — AB
Specific Gravity, Urine: 1.019 (ref 1.005–1.030)
WBC, UA: >50 WBC/hpf — ABNORMAL HIGH (ref 0–5)
pH: 5 (ref 5.0–8.0)

## 2022-04-16 LAB — COMPREHENSIVE METABOLIC PANEL
ALT: 20 U/L (ref 0–44)
AST: 25 U/L (ref 15–41)
Albumin: 4.2 g/dL (ref 3.5–5.0)
Alkaline Phosphatase: 77 U/L (ref 38–126)
Anion gap: 9 (ref 5–15)
BUN: 6 mg/dL (ref 6–20)
CO2: 27 mmol/L (ref 22–32)
Calcium: 9.5 mg/dL (ref 8.9–10.3)
Chloride: 104 mmol/L (ref 98–111)
Creatinine, Ser: 0.81 mg/dL (ref 0.61–1.24)
GFR, Estimated: >60 mL/min (ref >60)
Glucose, Bld: 109 mg/dL — ABNORMAL HIGH (ref 70–99)
Potassium: 3.6 mmol/L (ref 3.5–5.1)
Sodium: 140 mmol/L (ref 135–145)
Total Bilirubin: 0.3 mg/dL (ref 0.3–1.2)
Total Protein: 7.5 g/dL (ref 6.5–8.1)

## 2022-04-16 LAB — LACTIC ACID, PLASMA
Lactic Acid, Venous: 2.1 mmol/L (ref 0.5–1.9)
Lactic Acid, Venous: 2.9 mmol/L (ref 0.5–1.9)

## 2022-04-16 LAB — POC OCCULT BLOOD, ED: Fecal Occult Bld: NEGATIVE

## 2022-04-16 LAB — CBC
HCT: 49.3 % (ref 39.0–52.0)
Hemoglobin: 17.2 g/dL — ABNORMAL HIGH (ref 13.0–17.0)
MCH: 36.2 pg — ABNORMAL HIGH (ref 26.0–34.0)
MCHC: 34.9 g/dL (ref 30.0–36.0)
MCV: 103.8 fL — ABNORMAL HIGH (ref 80.0–100.0)
Platelets: 304 10*3/uL (ref 150–400)
RBC: 4.75 MIL/uL (ref 4.22–5.81)
RDW: 12.4 % (ref 11.5–15.5)
WBC: 5.9 10*3/uL (ref 4.0–10.5)
nRBC: 0 % (ref 0.0–0.2)

## 2022-04-16 LAB — LIPASE, BLOOD: Lipase: 35 U/L (ref 11–51)

## 2022-04-16 MED ORDER — LACTATED RINGERS IV BOLUS
1500.0000 mL | Freq: Once | INTRAVENOUS | Status: AC
  Administered 2022-04-16: 1500 mL via INTRAVENOUS

## 2022-04-16 MED ORDER — PIPERACILLIN-TAZOBACTAM 3.375 G IVPB
3.3750 g | Freq: Once | INTRAVENOUS | Status: AC
  Administered 2022-04-16: 3.375 g via INTRAVENOUS
  Filled 2022-04-16: qty 50

## 2022-04-16 MED ORDER — SODIUM CHLORIDE 0.9 % IV SOLN: 1.0000 g | Freq: Once | INTRAVENOUS | Status: DC

## 2022-04-16 NOTE — Discharge Instructions (Signed)
Complete your course of antibiotics including the white tablets twice a day and the red capsules 3 times a day until you see your urologist in 2 days.  If you develop fevers, weakness, uncontrolled nausea or vomiting or any new complaints get rechecked immediately by returning here or going to [place] [facility] where your specialists reside.

## 2022-04-16 NOTE — ED Provider Notes (Signed)
[name]   CSN: [id number] Arrival date & time: 04/16/22  1020     History  Chief Complaint  Patient presents with   Abdominal Pain    Scott Fitzgerald is a 56 y.o. 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  Under the care of Dr. Bernardo Heater who he is scheduled to see in 2 days for a cystoscopy, is currently in the process of preop medical clearance in anticipation of surgical intervention with Dr. Peyton Najjar in [place] presenting for evaluation today of persistent nausea along with low abdominal pain and intermittent episodes of painful urination.  He denies home fevers or chills.  He endorses having frequent urination, states he has been compliant with the Bactrim and Keflex he was prescribed.  However upon review of these prescriptions which were prescribed on May 12, 10-day courses, he still has about 10 Keflex tablets.  On closer review he has been taking this medication twice daily although it was prescribed for 3 times daily dosing.  He is concerned about possible sepsis again, although denies fevers or chills.  His vital signs here are stable today.  He does endorse darker than normal stools but has also been taking Pepto-Bismol to try to treat his nausea.  According to his hospitalization records, urine was positive for E. coli, susceptible to Zosyn.    The history is provided by the patient and medical records.      Home Medications Prior to Admission medications   Medication Sig Start Date End Date Taking? Authorizing Provider  albuterol (PROVENTIL) (2.5 MG/3ML) 0.083% nebulizer solution Take 3 mLs (2.5 mg total) by nebulization every 6 (six) hours as needed for wheezing or shortness of breath. Patient not taking: Reported on 04/09/2022 01/25/21   Noreene Larsson, NP  albuterol (VENTOLIN HFA) 108 (90 Base) MCG/ACT inhaler Inhale 2 puffs into the lungs every  4 (four) hours as needed for wheezing or shortness of breath. Patient not taking: Reported on 04/09/2022 05/08/21   Noreene Larsson, NP  nicotine (NICODERM CQ - DOSED IN MG/24 HOURS) 21 mg/24hr patch Place 1 patch (21 mg total) onto the skin daily. 04/09/22   Alvira Monday, FNP  ondansetron (ZOFRAN) 4 MG tablet Take 1 tablet (4 mg total) by mouth every 8 (eight) hours as needed for nausea or vomiting. 04/09/22   Alvira Monday, FNP      Allergies    Patient has no known allergies.    Review of Systems   Review of Systems  Constitutional:  Positive for fatigue. Negative for chills and fever.  HENT:  Negative for congestion and sore throat.   Eyes: Negative.   Respiratory:  Negative for chest tightness and shortness of breath.   Cardiovascular:  Negative for chest pain.  Gastrointestinal:  Positive for abdominal pain and nausea. Negative for abdominal distention, constipation, diarrhea and vomiting.  Genitourinary:  Positive for dysuria.  Musculoskeletal:  Negative for arthralgias, joint swelling and neck pain.  Skin: Negative.  Negative for rash and wound.  Neurological:  Negative for dizziness, weakness, light-headedness, numbness and headaches.  Psychiatric/Behavioral: Negative.     Physical Exam Updated Vital Signs BP 134/73 (BP Location: Left Arm)   Pulse 86   Temp 98.3 F (36.8 C) (Rectal)   Resp 18   Ht '5\' 5"'$  (1.651 m)   Wt 55.5 kg   SpO2 95%   BMI 20.35 kg/m  Physical Exam Vitals  and nursing note reviewed.  Constitutional:      Appearance: He is well-developed.  HENT:     Head: Normocephalic and atraumatic.  Eyes:     Conjunctiva/sclera: Conjunctivae normal.  Cardiovascular:     Rate and Rhythm: Normal rate and regular rhythm.     Heart sounds: Normal heart sounds.  Pulmonary:     Effort: Pulmonary effort is normal.     Breath sounds: Normal breath sounds. No wheezing.  Abdominal:     General: Bowel sounds are normal.     Palpations: Abdomen is soft.      Tenderness: There is abdominal tenderness in the suprapubic area. There is no guarding or rebound.  Musculoskeletal:        General: Normal range of motion.     Cervical back: Normal range of motion.  Skin:    General: Skin is warm and dry.  Neurological:     Mental Status: He is alert.    ED Results / Procedures / Treatments   Labs (all labs ordered are listed, but only abnormal results are displayed) Labs Reviewed  COMPREHENSIVE METABOLIC PANEL - Abnormal; Notable for the following components:      Result Value   Glucose, Bld 109 (*)    All other components within normal limits  CBC - Abnormal; Notable for the following components:   Hemoglobin 17.2 (*)    MCV 103.8 (*)    MCH 36.2 (*)    All other components within normal limits  URINALYSIS, ROUTINE W REFLEX MICROSCOPIC - Abnormal; Notable for the following components:   APPearance HAZY (*)    Hgb urine dipstick MODERATE (*)    Protein, ur 30 (*)    Leukocytes,Ua LARGE (*)    WBC, UA >50 (*)    Bacteria, UA MANY (*)    All other components within normal limits  LACTIC ACID, PLASMA - Abnormal; Notable for the following components:   Lactic Acid, Venous 2.1 (*)    All other components within normal limits  LACTIC ACID, PLASMA - Abnormal; Notable for the following components:   Lactic Acid, Venous 2.9 (*)    All other components within normal limits  URINE CULTURE  LIPASE, BLOOD  POC OCCULT BLOOD, ED    EKG None  Radiology No results found.  Procedures Procedures    Medications Ordered in ED Medications  lactated ringers bolus 1,500 mL (0 mLs Intravenous Stopped 04/16/22 1450)  piperacillin-tazobactam (ZOSYN) IVPB 3.375 g (0 g Intravenous Stopped 04/16/22 1422)    ED Course/ Medical Decision Making/ A&P                           Medical Decision Making Pt with recent urosepsis with known colovesical fistula awaiting surgical intervention,  persistent dysuria and lower abd pain with partially treated uti, pt  has been taking his keflex bid instead of tid.  Appears to have been taking his bactrim.  Given recent history but relatively benign exam,  a lactate was ordered to help eliminate sepsis despite no VS abdomen elevated suggesting sepsis.  This number was actually elevated at 2.1.  After getting this result he was given lactated Ringer's 30 cc/kg along with an IV dose of Zosyn.  We repeated a lactic acid level at the 2-hour mark and unfortunately this was elevated even more at 2.9.  Repeat vital signs are still stable, he is not hypotensive or tachycardic.  He remains afebrile.   Amount and/or  Complexity of Data Reviewed Labs: ordered.    Details: Patient has a normal WBC count.  He has an elevated hemoglobin level, of note he is a smoker.  His urine is significant for persistent infection with large number of leukocytes and many bacteria.  This is a clean-catch specimen.  Urine culture has been sent. Discussion of management or test interpretation with external provider(s): Discussed pt with Dr. Felipa Eth of urology who points out that until pt has his surgery to correct the fistula, he will continue to have an infected appearing urine.  Given stable vitals, no wbc elevation, no temp here, reasonable to dc home with strict return precautions and plans to see Dr Bernardo Heater as already scheduled in 2 days.   Risk Prescription drug management.           Final Clinical Impression(s) / ED Diagnoses Final diagnoses:  Colovesical fistula  Acute cystitis without hematuria    Rx / DC Orders ED Discharge Orders     None         Landis Martins 04/16/22 1645    Hayden Rasmussen, MD 04/16/22 1820

## 2022-04-16 NOTE — ED Triage Notes (Signed)
Pt having nausea with abdominal pain, treated at  [facility] for urology issues recently.

## 2022-04-18 ENCOUNTER — Ambulatory Visit: Payer: Self-pay | Admitting: General Surgery

## 2022-04-18 LAB — URINE CULTURE: Culture: >=100000 — AB

## 2022-04-18 NOTE — H&P (Signed)
HISTORY OF PRESENT ILLNESS:    [name] is a 56 y.o.male patient who comes for follow up from hospital admission due to colovesical fistula.  Patient has been admitted twice with multiple ED visits due to urinary tract infections.  The patient reports that he usually feels weak with nausea and vomiting.  He was diagnosed with complicated UTI.  Upon complete work-up he was found with colovesical fistula.  I personally evaluated the CT scan of the abdomen and pelvis.  Patient was able to have complete work-up with colonoscopy that showed diverticulosis with benign polyps.  They were removed completely.  He also had cystoscopy without any concern of malignancy.  Patient denies any previous diagnosis episode of diverticulitis.      PAST MEDICAL HISTORY:  Past Medical History:  Diagnosis Date   COPD (chronic obstructive pulmonary disease) (CMS-HCC)         PAST SURGICAL HISTORY:   History reviewed. No pertinent surgical history.       MEDICATIONS:  Outpatient Encounter Medications as of 04/18/2022  Medication Sig Dispense Refill   albuterol (PROVENTIL) 2.5 mg /3 mL (0.083 %) nebulizer solution Inhale 2.5 mg into the lungs every 6 (six) hours as needed     albuterol 90 mcg/actuation inhaler Inhale 2 inhalations into the lungs every 4 (four) hours as needed     ondansetron (ZOFRAN) 4 MG tablet Take 4 mg by mouth every 8 (eight) hours as needed     No facility-administered encounter medications on file as of 04/18/2022.     ALLERGIES:   Patient has no known allergies.   SOCIAL HISTORY:  Social History   Socioeconomic History   Marital status: Single  Tobacco Use   Smoking status: Every Day    Types: Cigarettes   Smokeless tobacco: Never  Vaping Use   Vaping Use: Never used  Substance and Sexual Activity   Alcohol use: Yes    Alcohol/week: 24.0 standard drinks    Types: 24 Cans of beer per week   Drug use: Never    FAMILY HISTORY:  Family History  Problem Relation Age  of Onset   Pneumonia Mother    Stroke Maternal Grandmother      GENERAL REVIEW OF SYSTEMS:   General ROS: negative for - chills, fatigue, fever, weight gain or weight loss Allergy and Immunology ROS: negative for - hives  Hematological and Lymphatic ROS: negative for - bleeding problems or bruising, negative for palpable nodes Endocrine ROS: negative for - heat or cold intolerance, hair changes Respiratory ROS: negative for - cough, shortness of breath or wheezing Cardiovascular ROS: no chest pain or palpitations GI ROS: negative for nausea, vomiting, abdominal pain, diarrhea, constipation Musculoskeletal ROS: negative for - joint swelling or muscle pain Neurological ROS: negative for - confusion, syncope Dermatological ROS: negative for pruritus and rash  PHYSICAL EXAM:  Vitals:   04/18/22 1030  BP: 129/80  Pulse: 93  .  Ht:165.1 cm ('5\' 5"'$ ) Wt:55.3 kg (122 lb) ZOX:WRUE surface area is 1.59 meters squared. Body mass index is 20.3 kg/m.Marland Kitchen   GENERAL: Alert, active, oriented x3  HEENT: Pupils equal reactive to light. Extraocular movements are intact. Sclera clear. Palpebral conjunctiva normal red color.Pharynx clear.  NECK: Supple with no palpable mass and no adenopathy.  LUNGS: Sound clear with no rales rhonchi or wheezes.  HEART: Regular rhythm S1 and S2 without murmur.  ABDOMEN: Soft and depressible, nontender with no palpable mass, no hepatomegaly.   EXTREMITIES: Well-developed well-nourished symmetrical with no dependent  edema.  NEUROLOGICAL: Awake alert oriented, facial expression symmetrical, moving all extremities.      IMPRESSION:     Colovesical fistula [N32.1]  Patient oriented about the implication of having a colovesical fistula.  He was oriented that this is the cause of recurrent infections.  He was agreeing to the rationale of having a partial colectomy.  Patient needs to have a partial colectomy due to recurrent urinary tract infection and unable to be  without antibiotic therapy.  He has had multiple admissions to the hospital in the last month including a visit to the emergency department.  Due to the multiple admissions and ED visits I seen the patient should proceed with partial colectomy soon as possible.  Patient cannot leave on antibiotic therapy for the rest of his life putting him at risk of C. Difficile.  I had a long discussion with the patient regarding the importance of smoking cessation to decrease the risk of complications such as anastomosis leak.  I also had a long discussion with the patient about the importance of alcohol abstinence.  These 2 factors putting him at risk of complication during the admission.  I was very thorough about these complications and patient verbalized that he will do his best to stop smoking and stop drinking alcohol.  I think that the patient is very serious about his intention to get better.  He is very compromised to doing the surgery since he has been feeling very sick since having these urinary tract infections.  I discussed with the patient the risk of surgery that includes anastomosis leak, intestinal obstruction, intra-abdominal infections, injury to intestine, kidney, ureter, bladder, among others.  Patient might need to have urology intervention if there is leak from the bladder after excision of the fistula.  He will need ICG green placement in the ureters for assisting in identification of the ureter.  We will coordinate with urology.         Patient also oriented about needing to leave the hospital for at least 2 to 5 days.   PLAN:  1.  Robotic assisted laparoscopic sigmoid colectomy ([id number], [id number]) 2.  CBC, CMP done 3.  Avoid taking aspirin 5 days before the surgery 4.  Take the bowel prep the day before the surgery as instructed today 5.  Contact us if you have any concern  Patient verbalized understanding, all questions were answered, and were agreeable with the plan outlined above.

## 2022-04-18 NOTE — H&P (View-Only) (Signed)
HISTORY OF PRESENT ILLNESS:    Mr. Elms is a 56 y.o.male patient who comes for follow up from hospital admission due to colovesical fistula.  Patient has been admitted twice with multiple ED visits due to urinary tract infections.  The patient reports that he usually feels weak with nausea and vomiting.  He was diagnosed with complicated UTI.  Upon complete work-up he was found with colovesical fistula.  I personally evaluated the CT scan of the abdomen and pelvis.  Patient was able to have complete work-up with colonoscopy that showed diverticulosis with benign polyps.  They were removed completely.  He also had cystoscopy without any concern of malignancy.  Patient denies any previous diagnosis episode of diverticulitis.      PAST MEDICAL HISTORY:  Past Medical History:  Diagnosis Date   COPD (chronic obstructive pulmonary disease) (CMS-HCC)         PAST SURGICAL HISTORY:   History reviewed. No pertinent surgical history.       MEDICATIONS:  Outpatient Encounter Medications as of 04/18/2022  Medication Sig Dispense Refill   albuterol (PROVENTIL) 2.5 mg /3 mL (0.083 %) nebulizer solution Inhale 2.5 mg into the lungs every 6 (six) hours as needed     albuterol 90 mcg/actuation inhaler Inhale 2 inhalations into the lungs every 4 (four) hours as needed     ondansetron (ZOFRAN) 4 MG tablet Take 4 mg by mouth every 8 (eight) hours as needed     No facility-administered encounter medications on file as of 04/18/2022.     ALLERGIES:   Patient has no known allergies.   SOCIAL HISTORY:  Social History   Socioeconomic History   Marital status: Single  Tobacco Use   Smoking status: Every Day    Types: Cigarettes   Smokeless tobacco: Never  Vaping Use   Vaping Use: Never used  Substance and Sexual Activity   Alcohol use: Yes    Alcohol/week: 24.0 standard drinks    Types: 24 Cans of beer per week   Drug use: Never    FAMILY HISTORY:  Family History  Problem Relation Age  of Onset   Pneumonia Mother    Stroke Maternal Grandmother      GENERAL REVIEW OF SYSTEMS:   General ROS: negative for - chills, fatigue, fever, weight gain or weight loss Allergy and Immunology ROS: negative for - hives  Hematological and Lymphatic ROS: negative for - bleeding problems or bruising, negative for palpable nodes Endocrine ROS: negative for - heat or cold intolerance, hair changes Respiratory ROS: negative for - cough, shortness of breath or wheezing Cardiovascular ROS: no chest pain or palpitations GI ROS: negative for nausea, vomiting, abdominal pain, diarrhea, constipation Musculoskeletal ROS: negative for - joint swelling or muscle pain Neurological ROS: negative for - confusion, syncope Dermatological ROS: negative for pruritus and rash  PHYSICAL EXAM:  Vitals:   04/18/22 1030  BP: 129/80  Pulse: 93  .  Ht:165.1 cm ('5\' 5"'$ ) Wt:55.3 kg (122 lb) NLG:XQJJ surface area is 1.59 meters squared. Body mass index is 20.3 kg/m.Marland Kitchen   GENERAL: Alert, active, oriented x3  HEENT: Pupils equal reactive to light. Extraocular movements are intact. Sclera clear. Palpebral conjunctiva normal red color.Pharynx clear.  NECK: Supple with no palpable mass and no adenopathy.  LUNGS: Sound clear with no rales rhonchi or wheezes.  HEART: Regular rhythm S1 and S2 without murmur.  ABDOMEN: Soft and depressible, nontender with no palpable mass, no hepatomegaly.   EXTREMITIES: Well-developed well-nourished symmetrical with no dependent  edema.  NEUROLOGICAL: Awake alert oriented, facial expression symmetrical, moving all extremities.      IMPRESSION:     Colovesical fistula [N32.1]  Patient oriented about the implication of having a colovesical fistula.  He was oriented that this is the cause of recurrent infections.  He was agreeing to the rationale of having a partial colectomy.  Patient needs to have a partial colectomy due to recurrent urinary tract infection and unable to be  without antibiotic therapy.  He has had multiple admissions to the hospital in the last month including a visit to the emergency department.  Due to the multiple admissions and ED visits I seen the patient should proceed with partial colectomy soon as possible.  Patient cannot leave on antibiotic therapy for the rest of his life putting him at risk of C. Difficile.  I had a long discussion with the patient regarding the importance of smoking cessation to decrease the risk of complications such as anastomosis leak.  I also had a long discussion with the patient about the importance of alcohol abstinence.  These 2 factors putting him at risk of complication during the admission.  I was very thorough about these complications and patient verbalized that he will do his best to stop smoking and stop drinking alcohol.  I think that the patient is very serious about his intention to get better.  He is very compromised to doing the surgery since he has been feeling very sick since having these urinary tract infections.  I discussed with the patient the risk of surgery that includes anastomosis leak, intestinal obstruction, intra-abdominal infections, injury to intestine, kidney, ureter, bladder, among others.  Patient might need to have urology intervention if there is leak from the bladder after excision of the fistula.  He will need ICG green placement in the ureters for assisting in identification of the ureter.  We will coordinate with urology.         Patient also oriented about needing to leave the hospital for at least 2 to 5 days.   PLAN:  1.  Robotic assisted laparoscopic sigmoid colectomy ([id number], [id number]) 2.  CBC, CMP done 3.  Avoid taking aspirin 5 days before the surgery 4.  Take the bowel prep the day before the surgery as instructed today 5.  Contact us if you have any concern  Patient verbalized understanding, all questions were answered, and were agreeable with the plan outlined above.

## 2022-04-19 ENCOUNTER — Telehealth: Payer: Self-pay | Admitting: *Deleted

## 2022-04-19 NOTE — Telephone Encounter (Signed)
Post ED Visit - Positive Culture Follow-up  Culture report reviewed by antimicrobial stewardship pharmacist: [facility] Team '[]'$  Elenor Quinones, Pharm.D. '[]'$  Heide Guile, Pharm.D., BCPS AQ-ID '[]'$  Parks Neptune, Pharm.D., BCPS '[]'$  Alycia Rossetti, Pharm.D., BCPS '[]'$  [place], Pharm.D., BCPS, AAHIVP '[]'$  Legrand Como, Pharm.D., BCPS, AAHIVP '[]'$  Salome Arnt, PharmD, BCPS '[]'$  Johnnette Gourd, PharmD, BCPS '[]'$  Hughes Better, PharmD, BCPS '[]'$  Leeroy Cha, PharmD '[]'$  Laqueta Linden, PharmD, BCPS '[]'$  Albertina Parr, PharmD  [place] Team '[]'$  Leodis Sias, PharmD '[]'$  Lindell Spar, PharmD '[]'$  Royetta Asal, PharmD '[]'$  Graylin Shiver, Rph '[]'$  Rema Fendt) Glennon Mac, PharmD '[]'$  Arlyn Dunning, PharmD '[]'$  Netta Cedars, PharmD '[]'$  Dia Sitter, PharmD '[]'$  Leone Haven, PharmD '[]'$  Gretta Arab, PharmD '[]'$  Theodis Shove, PharmD '[]'$  Peggyann Juba, PharmD '[]'$  Reuel Boom, PharmD   Positive urne culture No antibiotics indicatedand no further patient follow-up is required at this time.  Benedetto Goad PA-C  Harlon Flor [place] 04/19/2022, 9:29 AM

## 2022-04-19 NOTE — Progress Notes (Signed)
ED Antimicrobial Stewardship Positive Culture Follow Up   Scott Fitzgerald is an 56 y.o. male who presented to [facility] on 04/16/2022 with a chief complaint of  Chief Complaint  Patient presents with   Abdominal Pain    Recent Results (from the past 720 hour(s))  Urine Culture     Status: Abnormal   Collection Time: 03/20/22  1:47 PM   Specimen: Urine, Clean Catch  Result Value Ref Range Status   Specimen Description   Final    URINE, CLEAN CATCH Performed at [facility], [address], [place], [place] [address]    Special Requests   Final    NONE Performed at [facility], [address], [place], [place] [address]    Culture (A)  Final    >=100,000 COLONIES/mL ENTEROBACTER CLOACAE >=100,000 COLONIES/mL ESCHERICHIA COLI    Report Status 03/23/2022 FINAL  Final   Organism ID, Bacteria ENTEROBACTER CLOACAE (A)  Final   Organism ID, Bacteria ESCHERICHIA COLI (A)  Final      Susceptibility   Enterobacter cloacae - MIC*    CEFAZOLIN >=64 RESISTANT Resistant     CEFEPIME <=0.12 SENSITIVE Sensitive     CIPROFLOXACIN <=0.25 SENSITIVE Sensitive     GENTAMICIN <=1 SENSITIVE Sensitive     IMIPENEM <=0.25 SENSITIVE Sensitive     NITROFURANTOIN 32 SENSITIVE Sensitive     TRIMETH/SULFA 40 SENSITIVE Sensitive     PIP/TAZO <=4 SENSITIVE Sensitive     * >=100,000 COLONIES/mL ENTEROBACTER CLOACAE   Escherichia coli - MIC*    AMPICILLIN 4 SENSITIVE Sensitive     CEFAZOLIN <=4 SENSITIVE Sensitive     CEFEPIME <=0.12 SENSITIVE Sensitive     CEFTRIAXONE <=0.25 SENSITIVE Sensitive     CIPROFLOXACIN >=4 RESISTANT Resistant     GENTAMICIN <=1 SENSITIVE Sensitive     IMIPENEM <=0.25 SENSITIVE Sensitive     NITROFURANTOIN <=16 SENSITIVE Sensitive     TRIMETH/SULFA >=320 RESISTANT Resistant     AMPICILLIN/SULBACTAM <=2 SENSITIVE Sensitive     PIP/TAZO <=4 SENSITIVE Sensitive     * >=100,000 COLONIES/mL ESCHERICHIA COLI  Chlamydia/NGC rt PCR ([facility] only)     Status:  None   Collection Time: 03/20/22  1:47 PM   Specimen: Urine  Result Value Ref Range Status   Specimen source GC/Chlam URINE, RANDOM  Final   Chlamydia Tr NOT DETECTED NOT DETECTED Final   N gonorrhoeae NOT DETECTED NOT DETECTED Final    Comment: (NOTE) This CT/NG assay has not been evaluated in patients with a history of  hysterectomy. Performed at [facility], [address] [place], [place] [address]   Blood culture (routine x 2)     Status: None   Collection Time: 03/20/22  3:34 PM   Specimen: BLOOD  Result Value Ref Range Status   Specimen Description BLOOD [facility]  Final   Special Requests BOTTLES DRAWN AEROBIC AND ANAEROBIC BCAV  Final   Culture   Final    NO GROWTH 5 DAYS Performed at [facility], [address] [place],  [address]    Report Status 03/25/2022 FINAL  Final  Blood culture (routine x 2)     Status: None   Collection Time: 03/20/22  3:34 PM   Specimen: BLOOD  Result Value Ref Range Status   Specimen Description BLOOD LFOA  Final   Special Requests BOTTLES DRAWN AEROBIC AND ANAEROBIC [place]  Final   Culture   Final    NO GROWTH 5 DAYS Performed  at [facility], [address] [place], [place] [address]    Report Status 03/25/2022 FINAL  Final  Resp Panel by RT-PCR (Flu A&B, Covid) Nasopharyngeal Swab     Status: None   Collection Time: 03/20/22  3:34 PM   Specimen: Nasopharyngeal Swab; Nasopharyngeal(NP) swabs in vial transport medium  Result Value Ref Range Status   SARS Coronavirus 2 by RT PCR NEGATIVE NEGATIVE Final    Comment: (NOTE) SARS-CoV-2 target nucleic acids are NOT DETECTED.  The SARS-CoV-2 RNA is generally detectable in upper respiratory specimens during the acute phase of infection. The lowest concentration of SARS-CoV-2 viral copies this assay can detect is 138 copies/mL. A negative result does not preclude SARS-Cov-2 infection and should not be used as the sole basis for treatment or other patient  management decisions. A negative result may occur with  improper specimen collection/handling, submission of specimen other than nasopharyngeal swab, presence of viral mutation(s) within the areas targeted by this assay, and inadequate number of viral copies(<138 copies/mL). A negative result must be combined with clinical observations, patient history, and epidemiological information. The expected result is Negative.  Fact Sheet for Patients:  EntrepreneurPulse.com.au  Fact Sheet for Healthcare Providers:  IncredibleEmployment.be  This test is no t yet approved or cleared by the [place] FDA and  has been authorized for detection and/or diagnosis of SARS-CoV-2 by FDA under an Emergency Use Authorization (EUA). This EUA will remain  in effect (meaning this test can be used) for the duration of the COVID-19 declaration under Section 564(b)(1) of the Act, 21 U.S.C.section 360bbb-3(b)(1), unless the authorization is terminated  or revoked sooner.       Influenza A by PCR NEGATIVE NEGATIVE Final   Influenza B by PCR NEGATIVE NEGATIVE Final    Comment: (NOTE) The Xpert Xpress SARS-CoV-2/FLU/RSV plus assay is intended as an aid in the diagnosis of influenza from Nasopharyngeal swab specimens and should not be used as a sole basis for treatment. Nasal washings and aspirates are unacceptable for Xpert Xpress SARS-CoV-2/FLU/RSV testing.  Fact Sheet for Patients: EntrepreneurPulse.com.au  Fact Sheet for Healthcare Providers: IncredibleEmployment.be  This test is not yet approved or cleared by the [place] FDA and has been authorized for detection and/or diagnosis of SARS-CoV-2 by FDA under an Emergency Use Authorization (EUA). This EUA will remain in effect (meaning this test can be used) for the duration of the COVID-19 declaration under Section 564(b)(1) of the Act, 21 U.S.C. section 360bbb-3(b)(1),  unless the authorization is terminated or revoked.  Performed at [facility], [address] [place], [place] [address]   Urine Culture     Status: Abnormal   Collection Time: 04/04/22  9:06 AM   Specimen: Urine, Clean Catch  Result Value Ref Range Status   Specimen Description   Final    URINE, CLEAN CATCH Performed at [facility], [address], [place], [place] [address]    Special Requests   Final    NONE Performed at [facility], [address] [place],  [address]    Culture MULTIPLE SPECIES PRESENT, SUGGEST RECOLLECTION (A)  Final   Report Status 04/05/2022 FINAL  Final  Blood culture (routine x 2)     Status: None   Collection Time: 04/04/22 10:57 AM   Specimen: BLOOD  Result Value Ref Range Status   Specimen Description BLOOD LEFT [facility]  Final   Special Requests   Final    BOTTLES DRAWN AEROBIC AND ANAEROBIC Blood Culture adequate  volume   Culture   Final    NO GROWTH 7 DAYS Performed at [facility], [address] [place], [place] [address]    Report Status 04/11/2022 FINAL  Final  Blood culture (routine x 2)     Status: None   Collection Time: 04/04/22  2:41 PM   Specimen: BLOOD  Result Value Ref Range Status   Specimen Description BLOOD RIGHT ANTECUBITAL  Final   Special Requests   Final    BOTTLES DRAWN AEROBIC AND ANAEROBIC Blood Culture adequate volume   Culture   Final    NO GROWTH 7 DAYS Performed at [facility], [address] [place], [place] [address]    Report Status 04/11/2022 FINAL  Final  Microscopic Examination     Status: Abnormal   Collection Time: 04/11/22 10:19 AM   Urine  Result Value Ref Range Status   WBC, UA 6-10 (A) 0 - 5 /hpf Final   RBC 0-2 0 - 2 /hpf Final   Epithelial Cells (non renal) 0-10 0 - 10 /hpf Final   Casts Present (A) None seen /lpf Final   Cast Type Hyaline casts N/A Final   Bacteria, UA Many (A) None seen/Few Final  Urine Culture     Status: Abnormal    Collection Time: 04/16/22 11:28 AM   Specimen: Urine, Clean Catch  Result Value Ref Range Status   Specimen Description   Final    URINE, CLEAN CATCH Performed at [facility], [address] [place], [place] [address]    Special Requests   Final    NONE Performed at [facility], [address], [place], [place] [address]    Culture >=100,000 COLONIES/mL ESCHERICHIA COLI (A)  Final   Report Status 04/18/2022 FINAL  Final   Organism ID, Bacteria ESCHERICHIA COLI (A)  Final      Susceptibility   Escherichia coli - MIC*    AMPICILLIN >=32 RESISTANT Resistant     CEFAZOLIN >=64 RESISTANT Resistant     CEFEPIME <=0.12 SENSITIVE Sensitive     CEFTRIAXONE <=0.25 SENSITIVE Sensitive     CIPROFLOXACIN >=4 RESISTANT Resistant     GENTAMICIN <=1 SENSITIVE Sensitive     IMIPENEM <=0.25 SENSITIVE Sensitive     NITROFURANTOIN <=16 SENSITIVE Sensitive     TRIMETH/SULFA >=320 RESISTANT Resistant     AMPICILLIN/SULBACTAM >=32 RESISTANT Resistant     PIP/TAZO 8 SENSITIVE Sensitive     * >=100,000 COLONIES/mL ESCHERICHIA COLI    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 Per urology, will have infected appearing urine until fistula is fixed. Scheduled for 05/06/22. No antibiotics indicated.   ED Provider: Benedetto Goad, PA-C   Pauletta Browns 04/19/2022, 9:17 AM Clinical Pharmacist Monday - Friday phone -  [phone] Saturday - Sunday phone - [phone]

## 2022-04-22 DIAGNOSIS — S2231XA Fracture of one rib, right side, initial encounter for closed fracture: Secondary | ICD-10-CM

## 2022-04-22 HISTORY — DX: Fracture of one rib, right side, initial encounter for closed fracture: S22.31XA

## 2022-04-23 ENCOUNTER — Ambulatory Visit: Payer: Self-pay | Admitting: Family Medicine

## 2022-04-23 ENCOUNTER — Other Ambulatory Visit: Payer: Self-pay

## 2022-04-24 ENCOUNTER — Encounter: Payer: Self-pay | Admitting: Family Medicine

## 2022-04-24 ENCOUNTER — Ambulatory Visit (INDEPENDENT_AMBULATORY_CARE_PROVIDER_SITE_OTHER): Payer: Self-pay | Admitting: Family Medicine

## 2022-04-24 VITALS — BP 132/84 | HR 92 | Ht 65.0 in | Wt 124.4 lb

## 2022-04-24 DIAGNOSIS — W19XXXA Unspecified fall, initial encounter: Secondary | ICD-10-CM

## 2022-04-24 DIAGNOSIS — G312 Degeneration of nervous system due to alcohol: Secondary | ICD-10-CM

## 2022-04-24 DIAGNOSIS — F1010 Alcohol abuse, uncomplicated: Secondary | ICD-10-CM

## 2022-04-24 DIAGNOSIS — Z720 Tobacco use: Secondary | ICD-10-CM

## 2022-04-24 DIAGNOSIS — M940 Chondrocostal junction syndrome [Tietze]: Secondary | ICD-10-CM

## 2022-04-24 DIAGNOSIS — Z716 Tobacco abuse counseling: Secondary | ICD-10-CM

## 2022-04-24 MED ORDER — THIAMINE HCL 100 MG PO TABS: 100.0000 mg | ORAL_TABLET | Freq: Every day | ORAL | 1 refills | Status: DC

## 2022-04-24 MED ORDER — BUPROPION HCL ER (SR) 150 MG PO TB12: ORAL_TABLET | ORAL | 1 refills | Status: DC

## 2022-04-24 MED ORDER — FOLIC ACID 1 MG PO TABS: 1.0000 mg | ORAL_TABLET | Freq: Every day | ORAL | 0 refills | Status: DC

## 2022-04-24 MED ORDER — NAPROXEN 500 MG PO TABS: 500.0000 mg | ORAL_TABLET | Freq: Two times a day (BID) | ORAL | 0 refills | Status: AC

## 2022-04-24 NOTE — Patient Instructions (Addendum)
I appreciate the opportunity to provide care to you today!    Please stop by [facility] hospital anytime to get an X-ray of your ribs  -Please pick up your mediations at the pharmacy          Bupropion Smoking cessation: begin at least 1 week before quit date Bupropion should be started at least 1 week before the intended quit date. It is recommended to set a target quit date within the first 2 weeks of treatment. Take in the morning with or without food May cause dry mouth, insomnia, headache, rapid heart rate, ringing in ears, or agitation Report worsening mood or suicidal thinking to providers and caregivers   Please continue to a heart-healthy diet and increase your physical activities. Try to exercise for 33mns at least three times a week.      It was a pleasure to see you and I look forward to continuing to work together on your health and well-being. Please do not hesitate to call the office if you need care or have questions about your care.   Have a wonderful day and week. With Gratitude, GAlvira MondayMSN, FNP-BC

## 2022-04-24 NOTE — Assessment & Plan Note (Signed)
-  Shares that he would like to quit smoking but is concerned about withdrawal symptoms -Nicotine patch provides mild relief  Smokes about 2 & 1/2 pack/day  Asked about quitting: confirms that he currently smokes cigarettes Advise to quit smoking: Educated about QUITTING to reduce the risk of cancer, cardio and cerebrovascular disease. Assess willingness: willing to quit at this time and is working on cutting back. Assist with counseling and pharmacotherapy: Counseled for 5 minutes and literature provided. - Bupropion ( Wellbutrin SR ) ordered Arrange for follow up: follow up in 3 months

## 2022-04-24 NOTE — Assessment & Plan Note (Signed)
-  Shares that he would like to quit drinking but is concerned about withdrawal symptoms -Will order thiamine and folic acid to prevent alcoholic encephalopathy

## 2022-04-24 NOTE — Assessment & Plan Note (Addendum)
-  he reports landing on his right side on a metal chair and hearing a crack. -pain rated 10/10 -naproxen ordered -will get an x-ray of the ribs as initial imaging though X-rays are typically negative early - signs of bone healing may occur 3 weeks after symptom onset
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
## 2022-04-26 ENCOUNTER — Encounter
Admission: RE | Admit: 2022-04-26 | Discharge: 2022-04-26 | Disposition: A | Discharge status: Home / Self Care | Payer: Self-pay | Source: Ambulatory Visit | Attending: General Surgery | Admitting: General Surgery

## 2022-04-26 DIAGNOSIS — Z01818 Encounter for other preprocedural examination: Secondary | ICD-10-CM

## 2022-04-26 HISTORY — DX: Nicotine dependence, unspecified, uncomplicated: F17.200

## 2022-04-26 HISTORY — DX: Sepsis, unspecified organism: A41.9

## 2022-04-26 HISTORY — DX: Dyspnea, unspecified: R06.00

## 2022-04-26 HISTORY — DX: Headache, unspecified: R51.9

## 2022-04-26 HISTORY — DX: Urinary tract infection, site not specified: N39.0

## 2022-04-26 HISTORY — DX: Gastro-esophageal reflux disease without esophagitis: K21.9

## 2022-04-26 NOTE — Patient Instructions (Addendum)
Your procedure is scheduled on:05-06-22 Monday Report to the Registration Desk on the 1st floor of the [facility].Then proceed to the 2nd floor Surgery Desk To find out your arrival time, please call [phone] between 1PM - 3PM on:05-04-11 Friday If your arrival time is 6:00 am, do not arrive prior to that time as the [facility] entrance doors do not open until 6:00 am.  REMEMBER: Instructions that are not followed completely may result in serious medical risk, up to and including death; or upon the discretion of your surgeon and anesthesiologist your surgery may need to be rescheduled.  Do not eat food OR drink any liquids after midnight the night before surgery.  No gum chewing, lozengers or hard candies.  TAKE THESE MEDICATIONS THE MORNING OF SURGERY WITH A SIP OF WATER: -buPROPion [facility] SR)   Use your Albuterol Nebulizer the morning of surery  One week prior to surgery:-Last dose will be on Sunday 04-28-22 Stop Anti-inflammatories (NSAIDS) such as Advil, Aleve, Ibuprofen, Motrin, Naproxen, Naprosyn and Aspirin based products such as Excedrin, Goodys Powder, BC Powder.You may however, take Tylenol if needed for pain up until the day of surgery. Stop ANY OVER THE COUNTER supplements/vitamins 7 days prior to surgery-Continue your Thiamine and your Folic Acid up until the day prior to surgery   No Alcohol for 24 hours before or after surgery.  No Smoking including e-cigarettes for 24 hours prior to surgery.  No chewable tobacco products for at least 6 hours prior to surgery.  No nicotine patches on the day of surgery.  Do not use any "recreational" drugs for at least a week prior to your surgery.  Please be advised that the combination of cocaine and anesthesia may have negative outcomes, up to and including death. If you test positive for cocaine, your surgery will be cancelled.  On the morning of surgery brush your teeth with toothpaste and water, you may rinse your mouth  with mouthwash if you wish. Do not swallow any toothpaste or mouthwash.  Use CHG Soap as directed on instruction sheet.  Do not wear jewelry, make-up, hairpins, clips or nail polish.  Do not wear lotions, powders, or perfumes.   Do not shave body from the neck down 48 hours prior to surgery just in case you cut yourself which could leave a site for infection.  Also, freshly shaved skin may become irritated if using the CHG soap.  Contact lenses, hearing aids and dentures may not be worn into surgery.  Do not bring valuables to the hospital. [facility] is not responsible for any missing/lost belongings or valuables.   Notify your doctor if there is any change in your medical condition (cold, fever, infection).  Wear comfortable clothing (specific to your surgery type) to the hospital.  After surgery, you can help prevent lung complications by doing breathing exercises.  Take deep breaths and cough every 1-2 hours. Your doctor may order a device called an Incentive Spirometer to help you take deep breaths. When coughing or sneezing, hold a pillow firmly against your incision with both hands. This is called "splinting." Doing this helps protect your incision. It also decreases belly discomfort.  If you are being admitted to the hospital overnight, leave your suitcase in the car. After surgery it may be brought to your room.  If you are being discharged the day of surgery, you will not be allowed to drive home. You will need a responsible adult (18 years or older) to drive you home  and stay with you that night.   If you are taking public transportation, you will need to have a responsible adult (18 years or older) with you. Please confirm with your physician that it is acceptable to use public transportation.   Please call the [place] Dept. at [phone] if you have any questions about these instructions.  Surgery Visitation Policy:  Patients undergoing a surgery  or procedure may have two family members or support persons with them as long as the person is not COVID-19 positive or experiencing its symptoms.   Inpatient Visitation:    Visiting hours are 7 a.m. to 8 p.m. Up to four visitors are allowed at one time in a patient room, including children. The visitors may rotate out with other people during the day. One designated support person (adult) may remain overnight.

## 2022-05-01 ENCOUNTER — Encounter
Admission: RE | Admit: 2022-05-01 | Discharge: 2022-05-01 | Disposition: A | Discharge status: Home / Self Care | Payer: Self-pay | Source: Ambulatory Visit | Attending: General Surgery | Admitting: General Surgery

## 2022-05-01 DIAGNOSIS — Z01812 Encounter for preprocedural laboratory examination: Secondary | ICD-10-CM | POA: Insufficient documentation

## 2022-05-01 DIAGNOSIS — Z01818 Encounter for other preprocedural examination: Secondary | ICD-10-CM

## 2022-05-01 LAB — TYPE AND SCREEN
ABO/RH(D): A POS
Antibody Screen: NEGATIVE

## 2022-05-06 ENCOUNTER — Inpatient Hospital Stay: Payer: Self-pay | Admitting: Anesthesiology

## 2022-05-06 ENCOUNTER — Inpatient Hospital Stay
Admission: RE | Admit: 2022-05-06 | Discharge: 2022-05-08 | DRG: 330 | Disposition: A | Discharge status: Home / Self Care | Payer: Self-pay | Attending: General Surgery | Admitting: General Surgery

## 2022-05-06 ENCOUNTER — Encounter: Payer: Self-pay | Admitting: General Surgery

## 2022-05-06 ENCOUNTER — Encounter
Admission: RE | Disposition: A | Discharge status: Home / Self Care | Payer: Self-pay | Source: Home / Self Care | Attending: General Surgery

## 2022-05-06 ENCOUNTER — Other Ambulatory Visit: Payer: Self-pay

## 2022-05-06 DIAGNOSIS — Z823 Family history of stroke: Secondary | ICD-10-CM

## 2022-05-06 DIAGNOSIS — F1010 Alcohol abuse, uncomplicated: Secondary | ICD-10-CM | POA: Diagnosis present

## 2022-05-06 DIAGNOSIS — K5730 Diverticulosis of large intestine without perforation or abscess without bleeding: Principal | ICD-10-CM | POA: Diagnosis present

## 2022-05-06 DIAGNOSIS — Z87440 Personal history of urinary (tract) infections: Secondary | ICD-10-CM

## 2022-05-06 DIAGNOSIS — J449 Chronic obstructive pulmonary disease, unspecified: Secondary | ICD-10-CM | POA: Diagnosis present

## 2022-05-06 DIAGNOSIS — F17210 Nicotine dependence, cigarettes, uncomplicated: Secondary | ICD-10-CM | POA: Diagnosis present

## 2022-05-06 DIAGNOSIS — K219 Gastro-esophageal reflux disease without esophagitis: Secondary | ICD-10-CM | POA: Diagnosis present

## 2022-05-06 DIAGNOSIS — N321 Vesicointestinal fistula: Secondary | ICD-10-CM | POA: Diagnosis present

## 2022-05-06 LAB — GLUCOSE, CAPILLARY: Glucose-Capillary: 186 mg/dL — ABNORMAL HIGH (ref 70–99)

## 2022-05-06 SURGERY — COLECTOMY, SIGMOID, ROBOT-ASSISTED
Anesthesia: General | Site: Ureter

## 2022-05-06 MED ORDER — PHENYLEPHRINE 80 MCG/ML (10ML) SYRINGE FOR IV PUSH (FOR BLOOD PRESSURE SUPPORT)
PREFILLED_SYRINGE | INTRAVENOUS | Status: DC | PRN
  Administered 2022-05-06 (×4): 160 ug via INTRAVENOUS
  Administered 2022-05-06 (×2): 80 ug via INTRAVENOUS

## 2022-05-06 MED ORDER — MIDAZOLAM HCL 2 MG/2ML IJ SOLN
INTRAMUSCULAR | Status: DC | PRN
  Administered 2022-05-06: 2 mg via INTRAVENOUS

## 2022-05-06 MED ORDER — ALVIMOPAN 12 MG PO CAPS
12.0000 mg | ORAL_CAPSULE | ORAL | Status: AC
  Administered 2022-05-06: 12 mg via ORAL

## 2022-05-06 MED ORDER — ONDANSETRON HCL 4 MG/2ML IJ SOLN: 4.0000 mg | Freq: Once | INTRAMUSCULAR | Status: DC | PRN

## 2022-05-06 MED ORDER — BUPIVACAINE LIPOSOME 1.3 % IJ SUSP
INTRAMUSCULAR | Status: AC
  Filled 2022-05-06: qty 20

## 2022-05-06 MED ORDER — DEXAMETHASONE SODIUM PHOSPHATE 10 MG/ML IJ SOLN
INTRAMUSCULAR | Status: AC
  Filled 2022-05-06: qty 1

## 2022-05-06 MED ORDER — HEPARIN SODIUM (PORCINE) 5000 UNIT/ML IJ SOLN
INTRAMUSCULAR | Status: AC
  Administered 2022-05-06: 5000 [IU] via SUBCUTANEOUS
  Filled 2022-05-06: qty 1

## 2022-05-06 MED ORDER — ROCURONIUM BROMIDE 10 MG/ML (PF) SYRINGE
PREFILLED_SYRINGE | INTRAVENOUS | Status: AC
Start: 2022-05-06 — End: ?
  Filled 2022-05-06: qty 10

## 2022-05-06 MED ORDER — SODIUM CHLORIDE (PF) 0.9 % IJ SOLN
INTRAMUSCULAR | Status: DC | PRN
  Administered 2022-05-06: 100 mL

## 2022-05-06 MED ORDER — DEXAMETHASONE SODIUM PHOSPHATE 10 MG/ML IJ SOLN
INTRAMUSCULAR | Status: DC | PRN
  Administered 2022-05-06: 10 mg via INTRAVENOUS

## 2022-05-06 MED ORDER — BUPROPION HCL ER (SR) 150 MG PO TB12
150.0000 mg | ORAL_TABLET | Freq: Every day | ORAL | Status: DC
  Administered 2022-05-06 – 2022-05-08 (×3): 150 mg via ORAL
  Filled 2022-05-06 (×3): qty 1

## 2022-05-06 MED ORDER — CELECOXIB 200 MG PO CAPS
ORAL_CAPSULE | ORAL | Status: AC
  Filled 2022-05-06: qty 1

## 2022-05-06 MED ORDER — GLYCOPYRROLATE 0.2 MG/ML IJ SOLN
INTRAMUSCULAR | Status: DC | PRN
  Administered 2022-05-06: .2 mg via INTRAVENOUS

## 2022-05-06 MED ORDER — CELECOXIB 200 MG PO CAPS
200.0000 mg | ORAL_CAPSULE | ORAL | Status: AC
  Administered 2022-05-06: 200 mg via ORAL

## 2022-05-06 MED ORDER — ACETAMINOPHEN 500 MG PO TABS
ORAL_TABLET | ORAL | Status: AC
  Filled 2022-05-06: qty 2

## 2022-05-06 MED ORDER — MORPHINE SULFATE (PF) 4 MG/ML IV SOLN: 4.0000 mg | INTRAVENOUS | Status: DC | PRN

## 2022-05-06 MED ORDER — METHYLENE BLUE 1 % INJ SOLN
INTRAVENOUS | Status: AC
  Filled 2022-05-06: qty 10

## 2022-05-06 MED ORDER — ONDANSETRON HCL 4 MG/2ML IJ SOLN
INTRAMUSCULAR | Status: AC
  Filled 2022-05-06: qty 2

## 2022-05-06 MED ORDER — FOLIC ACID 1 MG PO TABS: 1.0000 mg | ORAL_TABLET | Freq: Every day | ORAL | Status: DC

## 2022-05-06 MED ORDER — "VISTASEAL 4 ML SINGLE DOSE KIT "
PACK | CUTANEOUS | Status: DC | PRN
  Administered 2022-05-06: 4 mL via TOPICAL

## 2022-05-06 MED ORDER — SODIUM CHLORIDE 0.9 % IV SOLN: INTRAVENOUS | Status: DC

## 2022-05-06 MED ORDER — DEXMEDETOMIDINE HCL IN NACL 80 MCG/20ML IV SOLN
INTRAVENOUS | Status: AC
  Filled 2022-05-06: qty 20

## 2022-05-06 MED ORDER — KETAMINE HCL 50 MG/5ML IJ SOSY
PREFILLED_SYRINGE | INTRAMUSCULAR | Status: AC
  Filled 2022-05-06: qty 5

## 2022-05-06 MED ORDER — SODIUM CHLORIDE 0.9 % IR SOLN
Status: DC | PRN
  Administered 2022-05-06: 200 mL

## 2022-05-06 MED ORDER — ONDANSETRON 4 MG PO TBDP: 4.0000 mg | ORAL_TABLET | Freq: Four times a day (QID) | ORAL | Status: DC | PRN

## 2022-05-06 MED ORDER — LORAZEPAM 2 MG/ML IJ SOLN
1.0000 mg | INTRAMUSCULAR | Status: DC | PRN
  Administered 2022-05-08: 2 mg via INTRAVENOUS
  Filled 2022-05-06: qty 1

## 2022-05-06 MED ORDER — NICOTINE 21 MG/24HR TD PT24
21.0000 mg | MEDICATED_PATCH | Freq: Every day | TRANSDERMAL | Status: DC
Start: 2022-05-06 — End: 2022-05-08
  Administered 2022-05-06 – 2022-05-08 (×3): 21 mg via TRANSDERMAL
  Filled 2022-05-06 (×3): qty 1

## 2022-05-06 MED ORDER — FENTANYL CITRATE (PF) 100 MCG/2ML IJ SOLN
INTRAMUSCULAR | Status: AC
  Filled 2022-05-06: qty 2

## 2022-05-06 MED ORDER — PHENYLEPHRINE HCL-NACL 20-0.9 MG/250ML-% IV SOLN
INTRAVENOUS | Status: AC
  Filled 2022-05-06: qty 250

## 2022-05-06 MED ORDER — BUPIVACAINE HCL (PF) 0.5 % IJ SOLN
INTRAMUSCULAR | Status: AC
  Filled 2022-05-06: qty 30

## 2022-05-06 MED ORDER — SUGAMMADEX SODIUM 200 MG/2ML IV SOLN
INTRAVENOUS | Status: DC | PRN
  Administered 2022-05-06: 250 mg via INTRAVENOUS

## 2022-05-06 MED ORDER — PHENYLEPHRINE HCL-NACL 20-0.9 MG/250ML-% IV SOLN
INTRAVENOUS | Status: DC | PRN
  Administered 2022-05-06: 20 ug/min via INTRAVENOUS

## 2022-05-06 MED ORDER — GLYCOPYRROLATE 0.2 MG/ML IJ SOLN
INTRAMUSCULAR | Status: AC
  Filled 2022-05-06: qty 1

## 2022-05-06 MED ORDER — PHENYLEPHRINE 80 MCG/ML (10ML) SYRINGE FOR IV PUSH (FOR BLOOD PRESSURE SUPPORT)
PREFILLED_SYRINGE | INTRAVENOUS | Status: AC
  Filled 2022-05-06: qty 10

## 2022-05-06 MED ORDER — KETAMINE HCL 10 MG/ML IJ SOLN
INTRAMUSCULAR | Status: DC | PRN
  Administered 2022-05-06 (×2): 10 mg via INTRAVENOUS
  Administered 2022-05-06: 20 mg via INTRAVENOUS
  Administered 2022-05-06: 10 mg via INTRAVENOUS

## 2022-05-06 MED ORDER — LIDOCAINE HCL (CARDIAC) PF 100 MG/5ML IV SOSY
PREFILLED_SYRINGE | INTRAVENOUS | Status: DC | PRN
  Administered 2022-05-06: 60 mg via INTRAVENOUS

## 2022-05-06 MED ORDER — INDOCYANINE GREEN 25 MG IV SOLR
INTRAVENOUS | Status: DC | PRN
  Administered 2022-05-06: 50 mg

## 2022-05-06 MED ORDER — DEXMEDETOMIDINE (PRECEDEX) IN NS 20 MCG/5ML (4 MCG/ML) IV SYRINGE
PREFILLED_SYRINGE | INTRAVENOUS | Status: DC | PRN
  Administered 2022-05-06: 8 ug via INTRAVENOUS
  Administered 2022-05-06 (×3): 4 ug via INTRAVENOUS

## 2022-05-06 MED ORDER — ROCURONIUM BROMIDE 10 MG/ML (PF) SYRINGE
PREFILLED_SYRINGE | INTRAVENOUS | Status: AC
  Filled 2022-05-06: qty 10

## 2022-05-06 MED ORDER — THIAMINE HCL 100 MG/ML IJ SOLN
100.0000 mg | Freq: Every day | INTRAMUSCULAR | Status: DC
  Filled 2022-05-06 (×2): qty 2

## 2022-05-06 MED ORDER — ACETAMINOPHEN 500 MG PO TABS
1000.0000 mg | ORAL_TABLET | ORAL | Status: AC
  Administered 2022-05-06: 1000 mg via ORAL

## 2022-05-06 MED ORDER — SODIUM CHLORIDE 0.9 % IV SOLN
INTRAVENOUS | Status: AC
  Filled 2022-05-06: qty 2

## 2022-05-06 MED ORDER — ALVIMOPAN 12 MG PO CAPS
12.0000 mg | ORAL_CAPSULE | Freq: Two times a day (BID) | ORAL | Status: DC
  Administered 2022-05-07: 12 mg via ORAL
  Filled 2022-05-06 (×2): qty 1

## 2022-05-06 MED ORDER — ROCURONIUM BROMIDE 100 MG/10ML IV SOLN
INTRAVENOUS | Status: DC | PRN
  Administered 2022-05-06: 30 mg via INTRAVENOUS
  Administered 2022-05-06 (×2): 20 mg via INTRAVENOUS
  Administered 2022-05-06: 30 mg via INTRAVENOUS
  Administered 2022-05-06: 50 mg via INTRAVENOUS
  Administered 2022-05-06: 30 mg via INTRAVENOUS

## 2022-05-06 MED ORDER — GABAPENTIN 300 MG PO CAPS
300.0000 mg | ORAL_CAPSULE | ORAL | Status: AC
  Administered 2022-05-06: 300 mg via ORAL

## 2022-05-06 MED ORDER — MIDAZOLAM HCL 2 MG/2ML IJ SOLN
INTRAMUSCULAR | Status: AC
  Filled 2022-05-06: qty 2

## 2022-05-06 MED ORDER — FENTANYL CITRATE (PF) 100 MCG/2ML IJ SOLN: 25.0000 ug | INTRAMUSCULAR | Status: DC | PRN

## 2022-05-06 MED ORDER — HEPARIN SODIUM (PORCINE) 5000 UNIT/ML IJ SOLN: 5000.0000 [IU] | Freq: Once | INTRAMUSCULAR | Status: AC

## 2022-05-06 MED ORDER — GABAPENTIN 300 MG PO CAPS
ORAL_CAPSULE | ORAL | Status: AC
  Filled 2022-05-06: qty 1

## 2022-05-06 MED ORDER — CHLORHEXIDINE GLUCONATE 0.12 % MT SOLN
OROMUCOSAL | Status: AC
  Administered 2022-05-06: 15 mL via OROMUCOSAL
  Filled 2022-05-06: qty 15

## 2022-05-06 MED ORDER — CELECOXIB 200 MG PO CAPS
200.0000 mg | ORAL_CAPSULE | Freq: Two times a day (BID) | ORAL | Status: DC
  Administered 2022-05-06 – 2022-05-08 (×4): 200 mg via ORAL
  Filled 2022-05-06 (×4): qty 1

## 2022-05-06 MED ORDER — SODIUM CHLORIDE 0.9 % IV SOLN
2.0000 g | INTRAVENOUS | Status: AC
  Administered 2022-05-06: 2 g via INTRAVENOUS

## 2022-05-06 MED ORDER — ADULT MULTIVITAMIN W/MINERALS CH
1.0000 | ORAL_TABLET | Freq: Every day | ORAL | Status: DC
  Administered 2022-05-06 – 2022-05-08 (×3): 1 via ORAL
  Filled 2022-05-06 (×3): qty 1

## 2022-05-06 MED ORDER — ENOXAPARIN SODIUM 40 MG/0.4ML IJ SOSY
40.0000 mg | PREFILLED_SYRINGE | INTRAMUSCULAR | Status: DC
  Administered 2022-05-07 – 2022-05-08 (×2): 40 mg via SUBCUTANEOUS
  Filled 2022-05-06 (×2): qty 0.4

## 2022-05-06 MED ORDER — THIAMINE HCL 100 MG PO TABS: 100.0000 mg | ORAL_TABLET | Freq: Every day | ORAL | Status: DC

## 2022-05-06 MED ORDER — PANTOPRAZOLE SODIUM 40 MG IV SOLR
40.0000 mg | Freq: Every day | INTRAVENOUS | Status: DC
  Administered 2022-05-06 – 2022-05-07 (×2): 40 mg via INTRAVENOUS
  Filled 2022-05-06 (×2): qty 10

## 2022-05-06 MED ORDER — BUPIVACAINE LIPOSOME 1.3 % IJ SUSP: 20.0000 mL | Freq: Once | INTRAMUSCULAR | Status: DC

## 2022-05-06 MED ORDER — CHLORHEXIDINE GLUCONATE 0.12 % MT SOLN: 15.0000 mL | Freq: Once | OROMUCOSAL | Status: AC

## 2022-05-06 MED ORDER — INDOCYANINE GREEN 25 MG IV SOLR
INTRAVENOUS | Status: DC | PRN
  Administered 2022-05-06: 5 mg via INTRAVENOUS

## 2022-05-06 MED ORDER — FENTANYL CITRATE (PF) 100 MCG/2ML IJ SOLN
INTRAMUSCULAR | Status: DC | PRN
  Administered 2022-05-06 (×3): 50 ug via INTRAVENOUS

## 2022-05-06 MED ORDER — GABAPENTIN 300 MG PO CAPS
300.0000 mg | ORAL_CAPSULE | Freq: Two times a day (BID) | ORAL | Status: DC
  Administered 2022-05-06 – 2022-05-08 (×4): 300 mg via ORAL
  Filled 2022-05-06 (×4): qty 1

## 2022-05-06 MED ORDER — ALVIMOPAN 12 MG PO CAPS
ORAL_CAPSULE | ORAL | Status: AC
  Filled 2022-05-06: qty 1

## 2022-05-06 MED ORDER — HYDROCODONE-ACETAMINOPHEN 5-325 MG PO TABS
1.0000 | ORAL_TABLET | ORAL | Status: DC | PRN
  Administered 2022-05-06: 2 via ORAL
  Administered 2022-05-07 (×2): 1 via ORAL
  Administered 2022-05-07 – 2022-05-08 (×2): 2 via ORAL
  Filled 2022-05-06: qty 1
  Filled 2022-05-06 (×3): qty 2
  Filled 2022-05-06: qty 1

## 2022-05-06 MED ORDER — LIDOCAINE HCL (PF) 2 % IJ SOLN
INTRAMUSCULAR | Status: AC
Start: 2022-05-06 — End: ?
  Filled 2022-05-06: qty 5

## 2022-05-06 MED ORDER — FOLIC ACID 1 MG PO TABS
1.0000 mg | ORAL_TABLET | Freq: Every day | ORAL | Status: DC
  Administered 2022-05-06 – 2022-05-08 (×3): 1 mg via ORAL
  Filled 2022-05-06 (×3): qty 1

## 2022-05-06 MED ORDER — SODIUM CHLORIDE (PF) 0.9 % IJ SOLN
INTRAMUSCULAR | Status: AC
  Filled 2022-05-06: qty 50

## 2022-05-06 MED ORDER — PROPOFOL 10 MG/ML IV BOLUS
INTRAVENOUS | Status: AC
  Filled 2022-05-06: qty 20

## 2022-05-06 MED ORDER — LORAZEPAM 1 MG PO TABS
1.0000 mg | ORAL_TABLET | ORAL | Status: DC | PRN
  Administered 2022-05-07: 2 mg via ORAL
  Administered 2022-05-07: 1 mg via ORAL
  Administered 2022-05-07: 2 mg via ORAL
  Filled 2022-05-06: qty 2
  Filled 2022-05-06: qty 1
  Filled 2022-05-06: qty 2

## 2022-05-06 MED ORDER — LACTATED RINGERS IV SOLN: INTRAVENOUS | Status: DC

## 2022-05-06 MED ORDER — THIAMINE HCL 100 MG PO TABS
100.0000 mg | ORAL_TABLET | Freq: Every day | ORAL | Status: DC
  Administered 2022-05-06 – 2022-05-08 (×3): 100 mg via ORAL
  Filled 2022-05-06 (×4): qty 1

## 2022-05-06 MED ORDER — ORAL CARE MOUTH RINSE: 15.0000 mL | Freq: Once | OROMUCOSAL | Status: AC

## 2022-05-06 MED ORDER — PROPOFOL 10 MG/ML IV BOLUS
INTRAVENOUS | Status: DC | PRN
  Administered 2022-05-06: 120 mg via INTRAVENOUS
  Administered 2022-05-06: 30 mg via INTRAVENOUS

## 2022-05-06 MED ORDER — ONDANSETRON HCL 4 MG/2ML IJ SOLN: 4.0000 mg | Freq: Four times a day (QID) | INTRAMUSCULAR | Status: DC | PRN

## 2022-05-06 MED ORDER — ONDANSETRON HCL 4 MG/2ML IJ SOLN
INTRAMUSCULAR | Status: DC | PRN
  Administered 2022-05-06: 4 mg via INTRAVENOUS

## 2022-05-06 MED ORDER — ALBUTEROL SULFATE (2.5 MG/3ML) 0.083% IN NEBU
2.5000 mg | INHALATION_SOLUTION | Freq: Four times a day (QID) | RESPIRATORY_TRACT | Status: DC | PRN
Start: 2022-05-06 — End: 2022-05-08
  Administered 2022-05-07: 2.5 mg via RESPIRATORY_TRACT
  Filled 2022-05-06: qty 3

## 2022-05-06 SURGICAL SUPPLY — 101 items
APL LAPSCP 45 DL APL FL B (MISCELLANEOUS) ×2
APPLICATOR VISTASEAL FLEXIBLE (MISCELLANEOUS) ×1 IMPLANT
BAG DRAIN CYSTO-URO LG1000N (MISCELLANEOUS) ×3 IMPLANT
BASIN KIT SINGLE STR (MISCELLANEOUS) ×3 IMPLANT
BLADE CLIPPER SURG (BLADE) ×3 IMPLANT
BLADE SURG SZ10 CARB STEEL (BLADE) ×3 IMPLANT
BLADE SURG SZ11 CARB STEEL (BLADE) ×3 IMPLANT
CANNULA REDUC XI 12-8 STAPL (CANNULA) ×1
CANNULA REDUCER 12-8 DVNC XI (CANNULA) ×2 IMPLANT
CATH FOL 3WAY LX 18X30 (CATHETERS) ×1 IMPLANT
CATH FOLEY 2WAY  5CC 16FR (CATHETERS)
CATH FOLEY 2WAY 5CC 16FR (CATHETERS)
CATH URETL OPEN 5X70 (CATHETERS) ×3 IMPLANT
CATH URTH 16FR FL 2W BLN LF (CATHETERS) ×2 IMPLANT
COVER TIP SHEARS 8 DVNC (MISCELLANEOUS) ×2 IMPLANT
COVER TIP SHEARS 8MM DA VINCI (MISCELLANEOUS) ×1
DRAPE ARM DVNC X/XI (DISPOSABLE) ×8 IMPLANT
DRAPE COLUMN DVNC XI (DISPOSABLE) ×2 IMPLANT
DRAPE DA VINCI XI ARM (DISPOSABLE) ×4
DRAPE DA VINCI XI COLUMN (DISPOSABLE) ×1
DRAPE LEGGINS SURG 28X43 STRL (DRAPES) ×3 IMPLANT
DRAPE UNDER BUTTOCK W/FLU (DRAPES) ×4 IMPLANT
DRSG OPSITE POSTOP 3X4 (GAUZE/BANDAGES/DRESSINGS) ×6 IMPLANT
ELECT REM PT RETURN 9FT ADLT (ELECTROSURGICAL) ×3
ELECTRODE REM PT RTRN 9FT ADLT (ELECTROSURGICAL) ×2 IMPLANT
GLOVE BIO SURGEON STRL SZ 6.5 (GLOVE) ×26 IMPLANT
GLOVE BIOGEL PI IND STRL 6.5 (GLOVE) ×6 IMPLANT
GLOVE BIOGEL PI INDICATOR 6.5 (GLOVE) ×10
GLOVE SURG SYN 6.5 ES PF (GLOVE) ×3 IMPLANT
GLOVE SURG SYN 6.5 PF PI (GLOVE) ×2 IMPLANT
GOWN STRL REUS W/ TWL LRG LVL3 (GOWN DISPOSABLE) ×14 IMPLANT
GOWN STRL REUS W/TWL LRG LVL3 (GOWN DISPOSABLE) ×45
GUIDEWIRE STR DUAL SENSOR (WIRE) ×1 IMPLANT
HANDLE YANKAUER SUCT BULB TIP (MISCELLANEOUS) ×3 IMPLANT
IRRIGATION STRYKERFLOW (MISCELLANEOUS) IMPLANT
IRRIGATOR STRYKERFLOW (MISCELLANEOUS)
IRRIGATOR SUCT 8 DISP DVNC XI (IRRIGATION / IRRIGATOR) IMPLANT
IRRIGATOR SUCTION 8MM XI DISP (IRRIGATION / IRRIGATOR) ×1
IV NS 1000ML (IV SOLUTION) ×6
IV NS 1000ML BAXH (IV SOLUTION) ×2 IMPLANT
KIT IMAGING PINPOINTPAQ (MISCELLANEOUS) ×6 IMPLANT
KIT PINK PAD W/HEAD ARE REST (MISCELLANEOUS) ×3
KIT PINK PAD W/HEAD ARM REST (MISCELLANEOUS) ×2 IMPLANT
LABEL OR SOLS (LABEL) ×1 IMPLANT
MANIFOLD NEPTUNE II (INSTRUMENTS) ×5 IMPLANT
NDL INSUFFLATION 14GA 120MM (NEEDLE) ×2 IMPLANT
NEEDLE HYPO 22GX1.5 SAFETY (NEEDLE) ×3 IMPLANT
NEEDLE INSUFFLATION 14GA 120MM (NEEDLE) ×3 IMPLANT
OBTURATOR OPTICAL STANDARD 8MM (TROCAR) ×1
OBTURATOR OPTICAL STND 8 DVNC (TROCAR) ×2
OBTURATOR OPTICALSTD 8 DVNC (TROCAR) ×2 IMPLANT
PACK COLON CLEAN CLOSURE (MISCELLANEOUS) ×3 IMPLANT
PACK CYSTO AR (MISCELLANEOUS) ×3 IMPLANT
PACK LAP CHOLECYSTECTOMY (MISCELLANEOUS) ×3 IMPLANT
PENCIL ELECTRO HAND CTR (MISCELLANEOUS) ×3 IMPLANT
PORT ACCESS TROCAR AIRSEAL 5 (TROCAR) ×3 IMPLANT
RELOAD STAPLE 45 3.5 BLU DVNC (STAPLE) IMPLANT
RELOAD STAPLE 60 3.5 BLU DVNC (STAPLE) IMPLANT
RELOAD STAPLER 3.5X45 BLU DVNC (STAPLE) ×4 IMPLANT
RELOAD STAPLER 3.5X60 BLU DVNC (STAPLE) ×4 IMPLANT
RETRACTOR RING XSMALL (MISCELLANEOUS) IMPLANT
RETRACTOR WOUND ALXS 18CM SML (MISCELLANEOUS) IMPLANT
RTRCTR WOUND ALEXIS 13CM XS SH (MISCELLANEOUS)
RTRCTR WOUND ALEXIS O 18CM SML (MISCELLANEOUS) ×3
SEAL CANN UNIV 5-8 DVNC XI (MISCELLANEOUS) ×6 IMPLANT
SEAL XI 5MM-8MM UNIVERSAL (MISCELLANEOUS) ×3
SEALER VESSEL DA VINCI XI (MISCELLANEOUS) ×1
SEALER VESSEL EXT DVNC XI (MISCELLANEOUS) IMPLANT
SET CYSTO W/LG BORE CLAMP LF (SET/KITS/TRAYS/PACK) ×3 IMPLANT
SET TRI-LUMEN FLTR TB AIRSEAL (TUBING) ×3 IMPLANT
SOL PREP PVP 2OZ (MISCELLANEOUS) ×3
SOLUTION ELECTROLUBE (MISCELLANEOUS) ×3 IMPLANT
SOLUTION PREP PVP 2OZ (MISCELLANEOUS) ×2 IMPLANT
SPONGE T-LAP 4X18 ~~LOC~~+RFID (SPONGE) ×3 IMPLANT
STAPLER 45 DA VINCI SURE FORM (STAPLE) ×1
STAPLER 45 SUREFORM DVNC (STAPLE) IMPLANT
STAPLER 60 DA VINCI SURE FORM (STAPLE) ×1
STAPLER 60 SUREFORM DVNC (STAPLE) IMPLANT
STAPLER CANNULA SEAL DVNC XI (STAPLE) ×2 IMPLANT
STAPLER CANNULA SEAL XI (STAPLE) ×1
STAPLER CIRCULAR MANUAL XL 29 (STAPLE) ×1 IMPLANT
STAPLER RELOAD 3.5X45 BLU DVNC (STAPLE) ×4
STAPLER RELOAD 3.5X45 BLUE (STAPLE) ×2
STAPLER RELOAD 3.5X60 BLU DVNC (STAPLE) ×4
STAPLER RELOAD 3.5X60 BLUE (STAPLE) ×2
STAPLER RELOADABLE 65 2-0 SUT (MISCELLANEOUS) IMPLANT
STAPLER SKIN PROX 35W (STAPLE) ×1 IMPLANT
STAPLER SYS INTERNAL RELOAD SS (MISCELLANEOUS) ×3 IMPLANT
SURGILUBE 2OZ TUBE FLIPTOP (MISCELLANEOUS) ×6 IMPLANT
SUT SILK 2 0 SH (SUTURE) ×1 IMPLANT
SUT STRATAFIX 0 PDS+ CT-2 23 (SUTURE) ×3
SUT VIC AB 3-0 SH 27 (SUTURE) ×3
SUT VIC AB 3-0 SH 27X BRD (SUTURE) ×8 IMPLANT
SUT VIC AB 4-0 FS2 27 (SUTURE) ×1 IMPLANT
SUT VICRYL 0 AB UR-6 (SUTURE) ×5 IMPLANT
SUTURE STRATFX 0 PDS+ CT-2 23 (SUTURE) IMPLANT
SYR 10ML LL (SYRINGE) ×6 IMPLANT
SYR 30ML LL (SYRINGE) ×6 IMPLANT
TOWEL OR 17X26 4PK STRL BLUE (TOWEL DISPOSABLE) ×1 IMPLANT
TRAY FOLEY MTR SLVR 16FR STAT (SET/KITS/TRAYS/PACK) ×3 IMPLANT
WATER STERILE IRR 500ML POUR (IV SOLUTION) ×7 IMPLANT

## 2022-05-06 NOTE — Anesthesia Procedure Notes (Signed)
Procedure Name: Intubation Date/Time: 05/06/2022 7:39 AM  Performed by: Tollie Eth, CRNAPre-anesthesia Checklist: Patient identified, Patient being monitored, Timeout performed, Emergency Drugs available and Suction available Patient Re-evaluated:Patient Re-evaluated prior to induction Oxygen Delivery Method: Circle system utilized Preoxygenation: Pre-oxygenation with 100% oxygen Induction Type: IV induction Ventilation: Mask ventilation without difficulty and Oral airway inserted - appropriate to patient size Laryngoscope Size: 3 and McGraph Grade View: Grade I Tube type: Oral Tube size: 7.0 mm Number of attempts: 1 Airway Equipment and Method: Stylet Placement Confirmation: ETT inserted through vocal cords under direct vision, positive ETCO2 and breath sounds checked- equal and bilateral Secured at: 21 cm Tube secured with: Tape Dental Injury: Teeth and Oropharynx as per pre-operative assessment

## 2022-05-06 NOTE — Plan of Care (Signed)
Pt arrived to unit AAOx3, drowsy. Moderate abdominal pain. VS are stable. Pt oriented to unit. Bed is in lowest position, call light within reach. Will continue to monitor.

## 2022-05-06 NOTE — Transfer of Care (Signed)
Immediate Anesthesia Transfer of Care Note  Patient: Scott Fitzgerald  Procedure(s) Performed: XI ROBOT ASSISTED SIGMOID COLECTOMY (Abdomen) INDOCYANINE GREEN FLUORESCENCE IMAGING (ICG) (Bilateral: Ureter)  Patient Location: PACU  Anesthesia Type:General  Level of Consciousness: drowsy  Airway & Oxygen Therapy: Patient Spontanous Breathing and Patient connected to face mask oxygen  Post-op Assessment: Report given to RN and Post -op Vital signs reviewed and stable  Post vital signs: Reviewed and stable  Last Vitals:  Vitals Value Taken Time  BP 108/65 05/06/22 1337  Temp    Pulse 66 05/06/22 1343  Resp 14 05/06/22 1343  SpO2 100 % 05/06/22 1343  Vitals shown include unvalidated device data.  Last Pain:  Vitals:   05/06/22 0625  TempSrc: Temporal  PainSc: 0-No pain         Complications: No notable events documented.

## 2022-05-06 NOTE — Interval H&P Note (Signed)
History and Physical Interval Note:  05/06/2022 6:50 AM  Scott Fitzgerald  has presented today for surgery, with the diagnosis of Colovesical fistula.  The various methods of treatment have been discussed with the patient and family. After consideration of risks, benefits and other options for treatment, the patient has consented to  Procedure(s): XI ROBOT ASSISTED SIGMOID COLECTOMY (N/A) INDOCYANINE GREEN FLUORESCENCE IMAGING (ICG) (Bilateral) as a surgical intervention.  The patient's history has been reviewed, patient examined, no change in status, stable for surgery.  I have reviewed the patient's chart and labs.  Questions were answered to the patient's satisfaction.     Herbert Pun

## 2022-05-06 NOTE — Interval H&P Note (Signed)
History and Physical Interval Note:  05/06/2022 7:15 AM  Scott Fitzgerald  has presented today for surgery, with the diagnosis of Colovesical fistula.  The various methods of treatment have been discussed with the patient and family. After consideration of risks, benefits and other options for treatment, the patient has consented to  Procedure(s): XI ROBOT ASSISTED SIGMOID COLECTOMY (N/A) INDOCYANINE GREEN FLUORESCENCE IMAGING (ICG) (Bilateral) as a surgical intervention.  The patient's history has been reviewed, patient examined, no change in status, stable for surgery.  I have reviewed the patient's chart and labs.  Questions were answered to the patient's satisfaction.    RRR CTAB  Plan for bilateral injection of ureteral ICG for the purpose of ureteral identification.  We discussed the risk including infection, ureteral edema amongst others.  All questions were answered.  Hollice Espy
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
## 2022-05-06 NOTE — Op Note (Signed)
Preoperative diagnosis: Colovesical fistula  Postoperative diagnosis: Colovesical fistula  Procedure: Robotic assisted laparoscopic sigmoid colectomy.   Anesthesia: GETA   Surgeon: Herbert Pun, MD  Assistant: Dr. Lysle Pearl    Wound Classification: Clean contaminated   Specimen: Sigmoid colon   Complications: None   Estimated Blood Loss: 50 mL   Indications: Patient is a 56 y.o. male who initially presented with recurrent UTI.  Work-up shows colovesical fistula.  Colonoscopy and cystoscopy preoperatively did not show any concern of malignancy.    FIndings: 1.  Colovesical fistula identified from sigmoid to posterior lower bladder next to the trigone. 2.  No leak from bladder after filling it with 400 cc of saline. 3.  Both ureters identified and preserved 4.  Adequate hemostasis 5.  No other abnormality identified   Description of procedure: The patient was placed on the operating table in the lithotomy position, both arms tucked. General anesthesia was induced.   Bilateral ICG green instillation of intravesical and both ureters by urology was done for ureteral identification.  A time-out was completed verifying correct patient, procedure, site, positioning, and implant(s) and/or special equipment prior to beginning this procedure. The abdomen was prepped and draped in the usual sterile fashion.    A Veress needle was inserted on Palmer's point.  Abdominal cavity was insufflated to 15 mmHg. Patient tolerated insufflation well.  An 8 mm port was inserted in an Optiview fashion in the right upper quadrant.  Two additional 8 mm ports and one 12 mm port were inserted under direct visualization along the right side of the abdominal wall. 5 mm assistant port was then placed on the right subcostal area.  No injuries from trocar placements were noted. The table was placed in the Trendelenburg position.  Xi robotic platform was then brought to the operative field and docked at an angle  from the left lower quadrant.  Tip up grasper and force bipolar were placed in the left arm ports.  Scissors were placed in right arm port.   Examination of the abdominal cavity noted a large portion of the sigmoid colon attached to the bladder.  I have very difficult and time-consuming dissection of the sigmoid colon was done to be able to separate the sigmoid colon from the bladder.  After the long dissection, I was able to separate the sigmoid colon from the bladder.  400 cc of saline were instilled in the bladder.  No saline was seen leaking from the bladder.  Then the sigmoid and descending colon were mobilized from the lateral attachment following the [employer]. This was followed up to the splenic flexure.  When mobilized, a small window on the mesentery of the distal descending colon where healthy colon was identified was done. Then the mesentery was divided with Vessel Sealer device. The mesentery was divided down to the rectosigmoid junction. At this point the rectosigmoid junction was divided with stapler device. 5 mg of ICG green was flushed intravenously and the site of adequate vascularity of the colonic flaps pedicles were identified.  At 3 mm midline incision was done on the paramedical area.  Dissection was taken down to the fascia.  The specimen was removed.  The descending colon was exteriorized.  A colotomy was done using the pursestring device.  The anvil was inserted and anvil secured with a pursestring device.  The descending colon was returned to the abdominal cavity.  The abdominal cavity was again insufflated.  The [employer] robot was again docked and I  went back to the console.  Once the descending colon was identified reaching the rectum without tension, the assistant surgeon introduced the 29 mm EEA rectally. It was guided under direct vision up to the level of the distal staple line on the rectal stump. The spike of the EEA device was then deployed to pierce the rectal  stump, the anvil was then attached to this spike, and the EEA device is closed and fired to perform a stapled end-to-end anastomosis. The doughnuts produced by the EEA stapler were checked to ensure that they were complete. Furthermore, an air leak test was carried out by insufflating air gently into the rectum, while the anastomosis was bathed underwater. A clamp was placed on the proximal colon to prevent its distention. Once the anastomosis was properly tested, the patient was repositioned back in the normal anatomical position. The trocars were removed under direct vision.  Using a skin closure technique, the fascia of the small midline incision was closed with a #0 STRATAFIX suture. The skin was closed with skin staples and a dry sterile dressing is applied. The sponge and instrument count were correct, blood loss was minimal, and there were no complications.    The patient tolerated the procedure well, awakened from anesthesia and was taken to the postanesthesia care unit in satisfactory condition.  Foley still in place.  Sponge count and instrument count correct at the end of the procedure.

## 2022-05-06 NOTE — Op Note (Signed)
Date of procedure: 05/06/22  Preoperative diagnosis:  Colovesical fistula  Postoperative diagnosis:  Same as above  Procedure: Cystoscopy Bilateral retrograde instillation of intravesical ICG for the purpose of ureteral identification  Surgeon: Hollice Espy, MD  Anesthesia: General  Complications: None  Intraoperative findings: Grossly abnormal bladder with inflammatory changes adjacent to the left trigone with debris, feculent appearing in the bladder.  Unremarkable retrograde injection of ICG.  EBL: Minimal  Specimens: None  Drains: 18 French three-way Foley catheter with 30 cc balloon  Indication: Scott Fitzgerald is a 56 y.o. patient with colovesical fistula undergoing repair with Dr. Windell Moment.  After reviewing the management options for treatment, he elected to proceed with the above surgical procedure(s). We have discussed the potential benefits and risks of the procedure, side effects of the proposed treatment, the likelihood of the patient achieving the goals of the procedure, and any potential problems that might occur during the procedure or recuperation. Informed consent has been obtained.  Description of procedure:  The patient was taken to the operating room and general anesthesia was induced.  The patient was placed in the dorsal lithotomy position, prepped and draped in the usual sterile fashion, and preoperative antibiotics were administered. A preoperative time-out was performed.   A 21 French scope was advanced per urethra into the bladder.  Notably, he had some narrowing at the fossa navicularis but ultimately was able to pass the scope using a blunt obturator.  In the bladder, there was feculent debris noted as well as erythema and edema primarily on the left posterior bladder wall adjacent but not involving the trigone.  Attention was turned to the right ureteral orifice which was easily cannulated using a 5 [place] open-ended ureteral catheter advanced  proximally 15 cm distally retracted at which time 10 cc of ICG was instilled along the course of the ureter.  On the left side due to the presence of inflammation, I cannulated the UO using a wire which went easily and then advanced an open-ended ureteral catheter, 5 [place] presumably to the level of the proximal ureter as it went quite easily and used a similar technique withdrawing the catheter injecting the 10 cc of ICG lingually on this side as well.  At the end of the procedure, placed an 25 French three-way Foley catheter and went ahead and hooked up to CBI tubing to be used later in the case to fill the bladder to check for any bladder leaks.  The remainder of the procedure that was completed by Dr. Windell Moment.  I did return to the operating room later during the procedure at which time the colon had been completely resected off the bladder.  ICG could be seen in both of the ureters and a small staining of ICG could be seen where the fistula had been dissected off the bladder but there is no obvious leaking of this material into the peritoneal cavity at this point in time.  We filled the bladder with 300 cc of sterile water by clamping of the Foley and the bladder held all of the fluid without any evidence of leak from this area.  We elected not to oversew this area given its proximity to the trigone and concern for disrupting or injuring the left distal ureter.  We will plan for cystogram next week and we will remove the Foley catheter at that point in time.  Hollice Espy, M.D.

## 2022-05-07 ENCOUNTER — Other Ambulatory Visit: Payer: Self-pay

## 2022-05-07 DIAGNOSIS — N321 Vesicointestinal fistula: Secondary | ICD-10-CM

## 2022-05-07 LAB — BASIC METABOLIC PANEL
Anion gap: 4 — ABNORMAL LOW (ref 5–15)
BUN: 8 mg/dL (ref 6–20)
CO2: 24 mmol/L (ref 22–32)
Calcium: 8.5 mg/dL — ABNORMAL LOW (ref 8.9–10.3)
Chloride: 114 mmol/L — ABNORMAL HIGH (ref 98–111)
Creatinine, Ser: 0.59 mg/dL — ABNORMAL LOW (ref 0.61–1.24)
GFR, Estimated: >60 mL/min (ref >60)
Glucose, Bld: 120 mg/dL — ABNORMAL HIGH (ref 70–99)
Potassium: 3.7 mmol/L (ref 3.5–5.1)
Sodium: 142 mmol/L (ref 135–145)

## 2022-05-07 LAB — CBC
HCT: 44.8 % (ref 39.0–52.0)
Hemoglobin: 15.3 g/dL (ref 13.0–17.0)
MCH: 35.7 pg — ABNORMAL HIGH (ref 26.0–34.0)
MCHC: 34.2 g/dL (ref 30.0–36.0)
MCV: 104.7 fL — ABNORMAL HIGH (ref 80.0–100.0)
Platelets: 221 10*3/uL (ref 150–400)
RBC: 4.28 MIL/uL (ref 4.22–5.81)
RDW: 12.3 % (ref 11.5–15.5)
WBC: 14.1 10*3/uL — ABNORMAL HIGH (ref 4.0–10.5)
nRBC: 0 % (ref 0.0–0.2)

## 2022-05-07 MED ORDER — CHLORHEXIDINE GLUCONATE CLOTH 2 % EX PADS
6.0000 | MEDICATED_PAD | Freq: Every day | CUTANEOUS | Status: DC
  Administered 2022-05-07 – 2022-05-08 (×2): 6 via TOPICAL

## 2022-05-07 MED ORDER — NICOTINE POLACRILEX 2 MG MT GUM
2.0000 mg | CHEWING_GUM | OROMUCOSAL | Status: DC | PRN
  Administered 2022-05-07 – 2022-05-08 (×2): 2 mg via ORAL
  Filled 2022-05-07 (×3): qty 1

## 2022-05-07 NOTE — Progress Notes (Signed)
Patient has tremors and is complaining of anxiety. Patient is currently on the CIWA protocol. Patient has been medicated twice since 1900. Explained safety procedure to patient. Patient is requesting to walk in the hallways, unable to provide 1;1 assistance due to staffing restraints. Patient has been told the side effects of detox and possible  complications. Patient is refusing to have bed alarm activated and await assistance.

## 2022-05-07 NOTE — Plan of Care (Signed)
  Problem: Education: Goal: Knowledge of General Education information will improve Description Including pain rating scale, medication(s)/side effects and non-pharmacologic comfort measures Outcome: Progressing   

## 2022-05-07 NOTE — Anesthesia Postprocedure Evaluation (Signed)
Anesthesia Post Note  Patient: Scott Fitzgerald  Procedure(s) Performed: XI ROBOT ASSISTED SIGMOID COLECTOMY (Abdomen) INDOCYANINE GREEN FLUORESCENCE IMAGING (ICG) (Bilateral: Ureter)  Patient location during evaluation: PACU Anesthesia Type: General Level of consciousness: awake and awake and alert Pain management: satisfactory to patient Vital Signs Assessment: post-procedure vital signs reviewed and stable Respiratory status: respiratory function stable and nonlabored ventilation Cardiovascular status: stable Anesthetic complications: no   No notable events documented.   Last Vitals:  Vitals:   05/07/22 0644 05/07/22 0731  BP:  118/70  Pulse: 85 86  Resp:  18  Temp:  37.3 C  SpO2:  95%    Last Pain:  Vitals:   05/07/22 0549  TempSrc: Oral  PainSc:                  VAN STAVEREN,Parthenia Tellefsen

## 2022-05-07 NOTE — Progress Notes (Addendum)
Patient ID: Scott Fitzgerald, male   DOB: May 06, 1966, 57 y.o.   MRN: [id number]     [name] [facility] Day(s): 1.   Interval History: Patient seen and examined, no acute events or new complaints overnight. Patient reports feeling.  Denies abdominal pain in the morning.  He endorsed that he had a small episode of abdominal pain.  Denied but resolved with pain medications.  Denies any nausea or vomiting.  Denies passing gas.  Vital signs in last 24 hours: [min-max] current  Temp:  [97 F (36.1 C)-99.1 F (37.3 C)] 99.1 F (37.3 C) (06/13 0731) Pulse Rate:  [65-119] 86 (06/13 0731) Resp:  [12-20] 18 (06/13 0731) BP: (112-136)/(60-76) 118/70 (06/13 0731) SpO2:  [92 %-100 %] 95 % (06/13 0731)     Height: '5\' 5"'$  (165.1 cm) Weight: 56.2 kg BMI (Calculated): 20.63   Physical Exam:  Constitutional: alert, cooperative and no distress  Respiratory: breathing non-labored at rest  Cardiovascular: regular rate and sinus rhythm  Gastrointestinal: soft, non-tender, and non-distended  Labs:     Latest Ref Rng & Units 05/07/2022    5:54 AM 04/16/2022   11:27 AM 04/05/2022    4:51 AM  CBC  WBC 4.0 - 10.5 K/uL 14.1  5.9  7.8   Hemoglobin 13.0 - 17.0 g/dL 15.3  17.2  13.4   Hematocrit 39.0 - 52.0 % 44.8  49.3  38.9   Platelets 150 - 400 K/uL 221  304  240       Latest Ref Rng & Units 05/07/2022    5:54 AM 04/16/2022   11:27 AM 04/05/2022    4:51 AM  CMP  Glucose 70 - 99 mg/dL 120  109  132   BUN 6 - 20 mg/dL '8  6  8   '$ Creatinine 0.61 - 1.24 mg/dL 0.59  0.81  0.71   Sodium 135 - 145 mmol/L 142  140  140   Potassium 3.5 - 5.1 mmol/L 3.7  3.6  3.3   Chloride 98 - 111 mmol/L 114  104  110   CO2 22 - 32 mmol/L '24  27  24   '$ Calcium 8.9 - 10.3 mg/dL 8.5  9.5  8.8   Total Protein 6.5 - 8.1 g/dL  7.5    Total Bilirubin 0.3 - 1.2 mg/dL  0.3    Alkaline Phos 38 - 126 U/L  77    AST 15 - 41 U/L  25    ALT 0 - 44 U/L  20      Imaging studies: No new pertinent imaging  studies   Assessment/Plan:  56 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  -No fever, stable vital signs -No abdominal pain.  No nausea or vomiting.  Not passing gas yet -We will advance to full liquid diet and assess for toleration. -Encourage the patient to ambulate -Continue pain management -Continue CIWA protocol  -Nicotine patch -FOLEY NEEDS TO STAY IN PLACE. PATIENT WILL BE DISCHARGED WITH FOLEY CATHETER.   Scott Long, MD

## 2022-05-08 LAB — ABO/RH: ABO/RH(D): A POS

## 2022-05-08 LAB — CBC
HCT: 39.9 % (ref 39.0–52.0)
Hemoglobin: 13.4 g/dL (ref 13.0–17.0)
MCH: 36.1 pg — ABNORMAL HIGH (ref 26.0–34.0)
MCHC: 33.6 g/dL (ref 30.0–36.0)
MCV: 107.5 fL — ABNORMAL HIGH (ref 80.0–100.0)
Platelets: 194 10*3/uL (ref 150–400)
RBC: 3.71 MIL/uL — ABNORMAL LOW (ref 4.22–5.81)
RDW: 12.5 % (ref 11.5–15.5)
WBC: 8.8 10*3/uL (ref 4.0–10.5)
nRBC: 0 % (ref 0.0–0.2)

## 2022-05-08 LAB — SURGICAL PATHOLOGY

## 2022-05-08 LAB — BASIC METABOLIC PANEL
Anion gap: 4 — ABNORMAL LOW (ref 5–15)
BUN: 6 mg/dL (ref 6–20)
CO2: 24 mmol/L (ref 22–32)
Calcium: 8 mg/dL — ABNORMAL LOW (ref 8.9–10.3)
Chloride: 113 mmol/L — ABNORMAL HIGH (ref 98–111)
Creatinine, Ser: 0.52 mg/dL — ABNORMAL LOW (ref 0.61–1.24)
GFR, Estimated: >60 mL/min (ref >60)
Glucose, Bld: 97 mg/dL (ref 70–99)
Potassium: 3.8 mmol/L (ref 3.5–5.1)
Sodium: 141 mmol/L (ref 135–145)

## 2022-05-08 MED ORDER — NICOTINE POLACRILEX 4 MG MT GUM: 4.0000 mg | CHEWING_GUM | OROMUCOSAL | 0 refills | Status: AC | PRN

## 2022-05-08 MED ORDER — NICOTINE 21 MG/24HR TD PT24: 21.0000 mg | MEDICATED_PATCH | TRANSDERMAL | 0 refills | Status: AC

## 2022-05-08 MED ORDER — HYDROCODONE-ACETAMINOPHEN 5-325 MG PO TABS: 1.0000 | ORAL_TABLET | ORAL | 0 refills | Status: AC | PRN

## 2022-05-08 NOTE — Discharge Instructions (Addendum)
  Diet: Resume home heart healthy soft diet.   Activity: No heavy lifting >20 pounds (children, pets, laundry, garbage) or strenuous activity until follow-up, but light activity and walking are encouraged. Do not drive or drink alcohol if taking narcotic pain medications.  Wound care: May shower with soapy water and pat dry (do not rub incisions), but no baths or submerging incision underwater until follow-up. (no swimming)   Keep foley in place until evaluated by Urology in the office.   Medications: Resume all home medications. For mild to moderate pain: acetaminophen (Tylenol) or ibuprofen (if no kidney disease). Combining Tylenol with alcohol can substantially increase your risk of causing liver disease. Narcotic pain medications, if prescribed, can be used for severe pain, though may cause nausea, constipation, and drowsiness. Do not combine Tylenol and Norco within a 6 hour period as Norco contains Tylenol. If you do not need the narcotic pain medication, you do not need to fill the prescription.  Use Miralax for constipation as needed.   Use nicotine patch and nicotine gum as prescribed.   Call office [phone]) at any time if any questions, worsening pain, fevers/chills, bleeding, drainage from incision site, or other concerns.

## 2022-05-08 NOTE — Progress Notes (Signed)
Patient was given discharge packet and patient verbalized understanding of the doctors appointment and medication changes. Supplies given for foley care, and patient was educated on foley care and emptying as well as changing bag. Patient personal belongings were returned to patient.

## 2022-05-08 NOTE — Discharge Summary (Signed)
Patient ID: Scott Fitzgerald MRN: [id number] DOB/AGE: [birth year] 56 y.o.  Admit date: 05/06/2022 Discharge date: 05/08/2022   Discharge Diagnoses:  Principal Problem:   Colovesical fistula   Procedures: Cystoscopy with instillation of ICG green for ureter identification Robotic assisted laparoscopic partial colectomy with anastomosis  Hospital Course: Patient admitted for management of colovesical fistula.  He underwent robotic assisted laparoscopic partial colectomy.  He has been tolerating the procedure well.  He has been stable without fever.  This morning adequate blood pressure, heart rate.  He is alert and oriented x3.  He is passing gas and had a bowel movement yesterday.  He is tolerating soft diet.  He does not have abdominal pain.  His white blood cell count normalized.  He has adequate urine output.  As per discussion with urology, patient with keep Foley in place until reevaluation on 05/14/2022.  Patient will go home with Foley.  The incisions are dry and clean.  Physical Exam Vitals reviewed.  Constitutional:      Appearance: Normal appearance.  HENT:     Head: Normocephalic.  Cardiovascular:     Rate and Rhythm: Normal rate and regular rhythm.  Pulmonary:     Effort: Pulmonary effort is normal.     Breath sounds: Normal breath sounds.  Abdominal:     General: Abdomen is flat.  Genitourinary:    Penis: Normal.   Musculoskeletal:     Cervical back: Normal range of motion.  Skin:    General: Skin is warm.     Capillary Refill: Capillary refill takes less than 2 seconds.  Neurological:     General: No focal deficit present.     Mental Status: He is alert and oriented to person, place, and time.  Psychiatric:        Behavior: Behavior normal.     Consults: None  Disposition: Discharge disposition: 01-Home or Self Care      Discharge Instructions     Diet - low sodium heart healthy   Complete by: As directed    Increase activity slowly   Complete  by: As directed       Allergies as of 05/08/2022   No Known Allergies      Medication List     TAKE these medications    albuterol (2.5 MG/3ML) 0.083% nebulizer solution Commonly known as: PROVENTIL Take 3 mLs (2.5 mg total) by nebulization every 6 (six) hours as needed for wheezing or shortness of breath.   Breztri Aerosphere 160-9-4.8 MCG/ACT Aero Generic drug: Budeson-Glycopyrrol-Formoterol Inhale 1 puff into the lungs as needed.   buPROPion 150 MG 12 hr tablet Commonly known as: Wellbutrin SR Take 1 tablet (150 mg total) by mouth daily for 3 days, THEN 2 tablets (300 mg total) daily. Start taking on: Apr 24, 2022   Dulera 200-5 MCG/ACT Aero Generic drug: mometasone-formoterol Inhale 2 puffs into the lungs as needed.   folic acid 1 MG tablet Commonly known as: FOLVITE Take 1 tablet (1 mg total) by mouth daily.   HYDROcodone-acetaminophen 5-325 MG tablet Commonly known as: Norco Take 1 tablet by mouth every 4 (four) hours as needed for up to 3 days for moderate pain.   ibuprofen 200 MG tablet Commonly known as: ADVIL Take 800 mg by mouth 2 (two) times daily.   naproxen 500 MG tablet Commonly known as: NAPROSYN Take 1 tablet (500 mg total) by mouth 2 (two) times daily with a meal.   nicotine 21 mg/24hr patch Commonly known as: NICODERM CQ -  dosed in mg/24 hours Place 1 patch (21 mg total) onto the skin daily. What changed: Another medication with the same name was added. Make sure you understand how and when to take each.   nicotine 21 mg/24hr patch Commonly known as: NICODERM CQ - dosed in mg/24 hours Place 1 patch (21 mg total) onto the skin daily. What changed: You were already taking a medication with the same name, and this prescription was added. Make sure you understand how and when to take each.   nicotine polacrilex 4 MG gum Commonly known as: Nicorette Take 1 each (4 mg total) by mouth as needed for smoking cessation.   ondansetron 4 MG  tablet Commonly known as: Zofran Take 1 tablet (4 mg total) by mouth every 8 (eight) hours as needed for nausea or vomiting.   thiamine 100 MG tablet Take 1 tablet (100 mg total) by mouth daily.        Follow-up Information     Herbert Pun, MD Follow up in 2 week(s).   Specialty: General Surgery Contact information: [place] [address] [phone]         Debroah Loop, PA-C Follow up on 05/14/2022.   Specialty: Urology Why: For foley removal. At 11:00 AM. Contact information: [place] [address] [phone]

## 2022-05-08 NOTE — TOC CM/SW Note (Signed)
Patient left before he could be seen. CSW acknowledges consult for [facility] resources. He was given resources in April and declined them in May. No further concerns. CSW signing off.  Scott Fitzgerald, [place]

## 2022-05-08 NOTE — Plan of Care (Signed)
  Problem: Clinical Measurements: Goal: Respiratory complications will improve Outcome: Adequate for Discharge Goal: Cardiovascular complication will be avoided Outcome: Adequate for Discharge   Problem: Nutrition: Goal: Adequate nutrition will be maintained Outcome: Progressing   Problem: Safety: Goal: Ability to remain free from injury will improve Outcome: Not Progressing

## 2022-05-09 ENCOUNTER — Other Ambulatory Visit: Payer: Self-pay

## 2022-05-09 ENCOUNTER — Encounter (HOSPITAL_COMMUNITY): Payer: Self-pay | Admitting: *Deleted

## 2022-05-09 ENCOUNTER — Telehealth: Payer: Self-pay | Admitting: *Deleted

## 2022-05-09 ENCOUNTER — Emergency Department (HOSPITAL_COMMUNITY)
Admission: EM | Admit: 2022-05-09 | Discharge: 2022-05-09 | Disposition: A | Discharge status: Home / Self Care | Payer: Self-pay | Attending: Emergency Medicine | Admitting: Emergency Medicine

## 2022-05-09 DIAGNOSIS — R58 Hemorrhage, not elsewhere classified: Secondary | ICD-10-CM

## 2022-05-09 DIAGNOSIS — S3022XA Contusion of scrotum and testes, initial encounter: Secondary | ICD-10-CM | POA: Insufficient documentation

## 2022-05-09 DIAGNOSIS — X58XXXA Exposure to other specified factors, initial encounter: Secondary | ICD-10-CM | POA: Insufficient documentation

## 2022-05-09 DIAGNOSIS — N5089 Other specified disorders of the male genital organs: Secondary | ICD-10-CM

## 2022-05-09 MED ORDER — HYDROCODONE-ACETAMINOPHEN 10-325 MG PO TABS: 1.0000 | ORAL_TABLET | Freq: Four times a day (QID) | ORAL | 0 refills | Status: DC | PRN

## 2022-05-09 NOTE — Telephone Encounter (Signed)
Transition Care Management Follow-up Telephone Call Date of discharge and from where: 05-08-22 [facility]  How have you been since you were released from the hospital? Not doing good cant get an appt until Monday  Any questions or concerns? No  Items Reviewed: Did the pt receive and understand the discharge instructions provided? Yes  Medications obtained and verified? Yes  Other? No  Any new allergies since your discharge? No  Dietary orders reviewed? Yes Do you have support at home? Yes   Home Care and Equipment/Supplies: Were home health services ordered? not applicable If so, what is the name of the agency? NA  Has the agency set up a time to come to the patient's home? not applicable Were any new equipment or medical supplies ordered?  No What is the name of the medical supply agency? NA Were you able to get the supplies/equipment? not applicable Do you have any questions related to the use of the equipment or supplies? No  Functional Questionnaire: (I = Independent and D = Dependent) ADLs: d  Bathing/Dressing- d  Meal Prep- d  Eating- i  Maintaining continence- d  Transferring/Ambulation- i  Managing Meds- i  Follow up appointments reviewed:  PCP Hospital f/u appt confirmed? Yes  Scheduled to see 05-14-22 on gloria @ 2:40 . [facility] f/u appt confirmed? Yes   Are transportation arrangements needed? No  If their condition worsens, is the pt aware to call PCP or go to the Emergency Dept.? Yes Was the patient provided with contact information for the PCP's office or ED? Yes Was to pt encouraged to call back with questions or concerns? Yes

## 2022-05-09 NOTE — ED Provider Notes (Signed)
[facility] EMERGENCY DEPARTMENT Provider Note   CSN: [id number] Arrival date & time: 05/09/22  2116     History  Chief Complaint  Patient presents with   Post-op Problem    Scott Fitzgerald is a 56 y.o. male presenting with swelling and edema of the testicle.  The patient underwent colovesicular fistula repair 1 week ago in the hospital, was discharged with Foley catheter in place, and has a follow-up appointment on Monday for cystogram and possible catheter removal afterwards.  He presents because he has noted some bruising discoloration initially of his abdomen but it is settled down around his scrotum and testes, and he also has swelling of the scrotum and testes.  He reports abdominal pain has been and "intense" despite the 5 mg Norco she is taking every 4 hours.  HPI     Home Medications Prior to Admission medications   Medication Sig Start Date End Date Taking? Authorizing Provider  HYDROcodone-acetaminophen (NORCO) 10-325 MG tablet Take 1 tablet by mouth every 6 (six) hours as needed for up to 15 doses for severe pain. 05/09/22  Yes Wyvonnia Dusky, MD  albuterol (PROVENTIL) (2.5 MG/3ML) 0.083% nebulizer solution Take 3 mLs (2.5 mg total) by nebulization every 6 (six) hours as needed for wheezing or shortness of breath. 01/25/21   Noreene Larsson, NP  Budeson-Glycopyrrol-Formoterol (BREZTRI AEROSPHERE) 160-9-4.8 MCG/ACT AERO Inhale 1 puff into the lungs as needed.    [provider]  buPROPion (WELLBUTRIN SR) 150 MG 12 hr tablet Take 1 tablet (150 mg total) by mouth daily for 3 days, THEN 2 tablets (300 mg total) daily. 04/24/22 07/20/22  Alvira Monday, FNP  folic acid (FOLVITE) 1 MG tablet Take 1 tablet (1 mg total) by mouth daily. 04/24/22   Alvira Monday, FNP  HYDROcodone-acetaminophen (NORCO) 5-325 MG tablet Take 1 tablet by mouth every 4 (four) hours as needed for up to 3 days for moderate pain. 05/08/22 05/11/22  Herbert Pun, MD  ibuprofen (ADVIL) 200 MG  tablet Take 800 mg by mouth 2 (two) times daily.    [provider]  mometasone-formoterol (DULERA) 200-5 MCG/ACT AERO Inhale 2 puffs into the lungs as needed.    [provider]  naproxen (NAPROSYN) 500 MG tablet Take 1 tablet (500 mg total) by mouth 2 (two) times daily with a meal. 04/24/22   Alvira Monday, FNP  nicotine (NICODERM CQ - DOSED IN MG/24 HOURS) 21 mg/24hr patch Place 1 patch (21 mg total) onto the skin daily. 04/09/22   Alvira Monday, FNP  nicotine (NICODERM CQ - DOSED IN MG/24 HOURS) 21 mg/24hr patch Place 1 patch (21 mg total) onto the skin daily. 05/08/22 06/07/22  Herbert Pun, MD  nicotine polacrilex (NICORETTE) 4 MG gum Take 1 each (4 mg total) by mouth as needed for smoking cessation. 05/08/22   Herbert Pun, MD  ondansetron (ZOFRAN) 4 MG tablet Take 1 tablet (4 mg total) by mouth every 8 (eight) hours as needed for nausea or vomiting. 04/09/22   Alvira Monday, FNP  thiamine 100 MG tablet Take 1 tablet (100 mg total) by mouth daily. 04/24/22   Alvira Monday, [place]      Allergies    Patient has no known allergies.    Review of Systems   Review of Systems  Physical Exam Updated Vital Signs BP 119/90 (BP Location: Right Arm)   Pulse 69   Temp 97.9 F (36.6 C) (Oral)   Resp 16   Ht '5\' 5"'$  (1.651 m)  Wt 56.2 kg   SpO2 99%   BMI 20.63 kg/m  Physical Exam Constitutional:      General: He is not in acute distress. HENT:     Head: Normocephalic and atraumatic.  Eyes:     Conjunctiva/sclera: Conjunctivae normal.     Pupils: Pupils are equal, round, and reactive to light.  Cardiovascular:     Rate and Rhythm: Normal rate and regular rhythm.  Pulmonary:     Effort: Pulmonary effort is normal. No respiratory distress.  Abdominal:     General: There is no distension.     Tenderness: There is no abdominal tenderness.     Comments: Operative sutures appear clean and intact, surrounding ecchymoses of the lower abdomen  Skin:     General: Skin is warm and dry.     Comments: Bruising, discoloration and edema of the testicle and the penis, with Foley in place  Neurological:     General: No focal deficit present.     Mental Status: He is alert. Mental status is at baseline.     ED Results / Procedures / Treatments   Labs (all labs ordered are listed, but only abnormal results are displayed) Labs Reviewed - No data to display  EKG None  Radiology No results found.  Procedures Procedures    Medications Ordered in ED Medications - No data to display  ED Course/ Medical Decision Making/ A&P                           Medical Decision Making Risk Prescription drug management.   Patient here discoloration and edema of the testes, as well as the lower abdomen, which I think is consistent with postoperative bruising.  He is not on blood thinners.  I doubt significant blood loss here.  I explained to him this is likely related to swelling of the abdomen and poor vascular outflow, which can lead to edema of the penis and the scrotum, and advised that he elevate the scrotum and penis at home, we can temporarily increase his Norco dosing to 10 mg until he is able to follow-up on Monday.  I would not remove the Foley catheter at this point as they are intending to use it for a cystogram on Monday, and it should be removed under the supervision of his surgeons or his surgical team.        Final Clinical Impression(s) / ED Diagnoses Final diagnoses:  Scrotal edema  Ecchymosis    Rx / DC Orders ED Discharge Orders          Ordered    HYDROcodone-acetaminophen (NORCO) 10-325 MG tablet  Every 6 hours PRN        05/09/22 2222              Wyvonnia Dusky, MD 05/09/22 2308

## 2022-05-09 NOTE — ED Notes (Signed)
Went over [employer]. Assisted in foley care. All questions answered. Ambulatory to lobby with registration .

## 2022-05-09 NOTE — Discharge Instructions (Addendum)
We talked about elevating her scrotum at home, putting a small folded towel underneath your scrotum and penis to lift it up.  Please follow-up with your surgeon early next week.

## 2022-05-09 NOTE — ED Triage Notes (Signed)
Pt states his penis is swollen and red, had foley catheter placed this past Monday. Pt with recent colon surgery on Monday as well. Pt seen the surgeon this morning, states penis was not red earlier.

## 2022-05-13 ENCOUNTER — Ambulatory Visit
Admission: RE | Admit: 2022-05-13 | Discharge: 2022-05-13 | Disposition: A | Discharge status: Home / Self Care | Payer: Self-pay | Source: Ambulatory Visit | Attending: Urology | Admitting: Urology

## 2022-05-13 ENCOUNTER — Ambulatory Visit (INDEPENDENT_AMBULATORY_CARE_PROVIDER_SITE_OTHER): Payer: Self-pay | Admitting: Physician Assistant

## 2022-05-13 ENCOUNTER — Ambulatory Visit: Payer: Self-pay | Admitting: Family Medicine

## 2022-05-13 VITALS — BP 146/94 | HR 74 | Ht 65.0 in | Wt 124.0 lb

## 2022-05-13 DIAGNOSIS — N321 Vesicointestinal fistula: Secondary | ICD-10-CM | POA: Insufficient documentation

## 2022-05-13 MED ORDER — IOTHALAMATE MEGLUMINE 17.2 % UR SOLN
250.0000 mL | Freq: Once | URETHRAL | Status: AC | PRN
  Administered 2022-05-13: 250 mL via INTRAVESICAL

## 2022-05-13 NOTE — Progress Notes (Signed)
Catheter Removal  Patient is present today for a catheter removal.  79m of water was drained from the balloon. A 16FR foley cath was removed from the bladder no complications were noted . Patient tolerated well.  Performed by: SDebroah Loop PA-C   Additional notes: Cystogram reviewed by me and SZara Councilwith no evidence of bladder leak.  Follow up: Return in about 6 weeks (around 06/24/2022) for Postop f/u with Dr. BErlene Quan

## 2022-05-14 ENCOUNTER — Ambulatory Visit: Payer: Self-pay | Admitting: Physician Assistant

## 2022-05-14 ENCOUNTER — Encounter: Payer: Self-pay | Admitting: Family Medicine

## 2022-05-14 ENCOUNTER — Ambulatory Visit (INDEPENDENT_AMBULATORY_CARE_PROVIDER_SITE_OTHER): Payer: Self-pay | Admitting: Family Medicine

## 2022-05-14 VITALS — BP 128/70 | HR 77 | Ht 65.0 in | Wt 124.8 lb

## 2022-05-14 DIAGNOSIS — K5903 Drug induced constipation: Secondary | ICD-10-CM

## 2022-05-14 DIAGNOSIS — R110 Nausea: Secondary | ICD-10-CM

## 2022-05-14 DIAGNOSIS — N321 Vesicointestinal fistula: Secondary | ICD-10-CM

## 2022-05-14 DIAGNOSIS — K5904 Chronic idiopathic constipation: Secondary | ICD-10-CM

## 2022-05-14 DIAGNOSIS — F1010 Alcohol abuse, uncomplicated: Secondary | ICD-10-CM

## 2022-05-14 DIAGNOSIS — N5089 Other specified disorders of the male genital organs: Secondary | ICD-10-CM

## 2022-05-14 DIAGNOSIS — Z720 Tobacco use: Secondary | ICD-10-CM

## 2022-05-14 DIAGNOSIS — J449 Chronic obstructive pulmonary disease, unspecified: Secondary | ICD-10-CM

## 2022-05-14 MED ORDER — ONDANSETRON HCL 4 MG PO TABS: 4.0000 mg | ORAL_TABLET | Freq: Three times a day (TID) | ORAL | 0 refills | Status: AC | PRN

## 2022-05-14 MED ORDER — SENNOSIDES 8.6 MG PO TABS: 1.0000 | ORAL_TABLET | Freq: Every day | ORAL | 0 refills | Status: DC

## 2022-05-14 MED ORDER — ALBUTEROL SULFATE HFA 108 (90 BASE) MCG/ACT IN AERS: 2.0000 | INHALATION_SPRAY | Freq: Four times a day (QID) | RESPIRATORY_TRACT | 0 refills | Status: DC | PRN

## 2022-05-14 MED ORDER — NICOTINE 21 MG/24HR TD PT24: 21.0000 mg | MEDICATED_PATCH | Freq: Every day | TRANSDERMAL | 0 refills | Status: DC

## 2022-05-14 MED ORDER — HYDROCODONE-ACETAMINOPHEN 10-325 MG PO TABS: 1.0000 | ORAL_TABLET | Freq: Four times a day (QID) | ORAL | 0 refills | Status: DC | PRN
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
## 2022-05-14 NOTE — Patient Instructions (Addendum)
I appreciate the opportunity to provide care to you today!      Labs: please stop by the lab today to get your blood drawn (CBC, BMP)  -Please pick up your medications at the pharmacy     Please continue to a heart-healthy diet and increase your physical activities. Try to exercise for 70mns at least three times a week.      It was a pleasure to see you and I look forward to continuing to work together on your health and well-being. Please do not hesitate to call the office if you need care or have questions about your care.   Have a wonderful day and week. With Gratitude, GAlvira MondayMSN, FNP-BC

## 2022-05-14 NOTE — Progress Notes (Unsigned)
oll

## 2022-05-15 LAB — CBC WITH DIFFERENTIAL/PLATELET
Basophils Absolute: 0.1 10*3/uL (ref 0.0–0.2)
Basos: 1 %
EOS (ABSOLUTE): 0.2 10*3/uL (ref 0.0–0.4)
Eos: 3 %
Hematocrit: 44 % (ref 37.5–51.0)
Hemoglobin: 15.4 g/dL (ref 13.0–17.7)
Immature Grans (Abs): 0 10*3/uL (ref 0.0–0.1)
Immature Granulocytes: 0 %
Lymphocytes Absolute: 1.4 10*3/uL (ref 0.7–3.1)
Lymphs: 18 %
MCH: 36.2 pg — ABNORMAL HIGH (ref 26.6–33.0)
MCHC: 35 g/dL (ref 31.5–35.7)
MCV: 104 fL — ABNORMAL HIGH (ref 79–97)
Monocytes Absolute: 0.7 10*3/uL (ref 0.1–0.9)
Monocytes: 10 %
Neutrophils Absolute: 5.3 10*3/uL (ref 1.4–7.0)
Neutrophils: 68 %
Platelets: 381 10*3/uL (ref 150–450)
RBC: 4.25 x10E6/uL (ref 4.14–5.80)
RDW: 12 % (ref 11.6–15.4)
WBC: 7.7 10*3/uL (ref 3.4–10.8)

## 2022-05-15 LAB — BASIC METABOLIC PANEL
BUN/Creatinine Ratio: 13 (ref 9–20)
BUN: 12 mg/dL (ref 6–24)
CO2: 23 mmol/L (ref 20–29)
Calcium: 9.9 mg/dL (ref 8.7–10.2)
Chloride: 100 mmol/L (ref 96–106)
Creatinine, Ser: 0.95 mg/dL (ref 0.76–1.27)
Glucose: 106 mg/dL — ABNORMAL HIGH (ref 70–99)
Potassium: 4.6 mmol/L (ref 3.5–5.2)
Sodium: 141 mmol/L (ref 134–144)
eGFR: 93 mL/min/{1.73_m2} (ref >59)

## 2022-05-15 NOTE — Assessment & Plan Note (Signed)
He reports to be cutting back from smoking 3 ppd to now only smoking 2 ppd.

## 2022-05-15 NOTE — Assessment & Plan Note (Signed)
Zofran ordered.

## 2022-05-15 NOTE — Progress Notes (Signed)
Please inform the patient that his labs are stabilizing. No concerns today

## 2022-05-15 NOTE — Assessment & Plan Note (Signed)
Since the surgery, the Patient reports doing well, mentioning that he went 2 weeks without drinking

## 2022-05-15 NOTE — Assessment & Plan Note (Signed)
-  Labs and imaging reviewed -Patient had a robotic-assisted laparoscopic partial sigmoid colectomy on 05/06/22 and reports doing well since the surgery. - Noted to be in pain and has been taking his narcotics and would like a refill -Refilled placed

## 2022-06-12 ENCOUNTER — Encounter: Payer: Self-pay | Admitting: Urology
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
## 2022-06-13 ENCOUNTER — Ambulatory Visit
Admission: RE | Admit: 2022-06-13 | Discharge: 2022-06-13 | Disposition: A | Discharge status: Home / Self Care | Payer: Self-pay | Source: Ambulatory Visit | Attending: General Surgery | Admitting: General Surgery

## 2022-06-13 ENCOUNTER — Other Ambulatory Visit: Payer: Self-pay | Admitting: General Surgery

## 2022-06-13 DIAGNOSIS — R1112 Projectile vomiting: Secondary | ICD-10-CM

## 2022-06-13 DIAGNOSIS — R1084 Generalized abdominal pain: Secondary | ICD-10-CM | POA: Insufficient documentation

## 2022-06-13 MED ORDER — IOHEXOL 350 MG/ML SOLN
60.0000 mL | Freq: Once | INTRAVENOUS | Status: AC | PRN
Start: 2022-06-13 — End: 2022-06-13
  Administered 2022-06-13: 60 mL via INTRAVENOUS

## 2022-06-18 ENCOUNTER — Ambulatory Visit: Payer: Self-pay | Admitting: Family Medicine

## 2022-06-27 ENCOUNTER — Encounter: Payer: Self-pay | Admitting: Family Medicine

## 2022-06-27 ENCOUNTER — Ambulatory Visit (INDEPENDENT_AMBULATORY_CARE_PROVIDER_SITE_OTHER): Payer: Self-pay | Admitting: Family Medicine

## 2022-06-27 VITALS — BP 127/67 | HR 67 | Ht 65.0 in | Wt 121.1 lb

## 2022-06-27 DIAGNOSIS — Z720 Tobacco use: Secondary | ICD-10-CM

## 2022-06-27 DIAGNOSIS — G312 Degeneration of nervous system due to alcohol: Secondary | ICD-10-CM

## 2022-06-27 DIAGNOSIS — F32A Depression, unspecified: Secondary | ICD-10-CM

## 2022-06-27 DIAGNOSIS — F419 Anxiety disorder, unspecified: Secondary | ICD-10-CM

## 2022-06-27 DIAGNOSIS — F1010 Alcohol abuse, uncomplicated: Secondary | ICD-10-CM

## 2022-06-27 DIAGNOSIS — R110 Nausea: Secondary | ICD-10-CM

## 2022-06-27 DIAGNOSIS — R630 Anorexia: Secondary | ICD-10-CM | POA: Insufficient documentation

## 2022-06-27 MED ORDER — THIAMINE HCL 100 MG PO TABS: 100.0000 mg | ORAL_TABLET | Freq: Every day | ORAL | 1 refills | Status: AC

## 2022-06-27 MED ORDER — FOLIC ACID 1 MG PO TABS: 1.0000 mg | ORAL_TABLET | Freq: Every day | ORAL | 0 refills | Status: AC

## 2022-06-27 MED ORDER — NICOTINE 21 MG/24HR TD PT24: 21.0000 mg | MEDICATED_PATCH | Freq: Every day | TRANSDERMAL | 0 refills | Status: AC

## 2022-06-27 MED ORDER — MIRTAZAPINE 7.5 MG PO TABS: 7.5000 mg | ORAL_TABLET | Freq: Every day | ORAL | 1 refills | Status: AC

## 2022-06-27 MED ORDER — METOCLOPRAMIDE HCL 5 MG PO TABS: 5.0000 mg | ORAL_TABLET | Freq: Four times a day (QID) | ORAL | 1 refills | Status: AC

## 2022-06-27 MED ORDER — BUSPIRONE HCL 7.5 MG PO TABS: 7.5000 mg | ORAL_TABLET | Freq: Two times a day (BID) | ORAL | 1 refills | Status: AC

## 2022-06-27 NOTE — Assessment & Plan Note (Signed)
Reports cutting back to 6-7 packs a day from 12-18 packs daily Refilled thiamine and folic acid

## 2022-06-27 NOTE — Assessment & Plan Note (Signed)
Chronic Request pharmacological therapy Reports increased worry and nervousness in social gatherings, denies depressive symptoms, and reports only having anxiety currently Will start the patient on Buspar 7.5 mg

## 2022-06-27 NOTE — Patient Instructions (Addendum)
I appreciate the opportunity to provide care to you today!    Follow up:  1 months f/u   Please pick up your medications at the pharmacy     Referrals today-    Please continue to a heart-healthy diet and increase your physical activities. Try to exercise for 21mns at least three times a week.      It was a pleasure to see you and I look forward to continuing to work together on your health and well-being. Please do not hesitate to call the office if you need care or have questions about your care.   Have a wonderful day and week. With Gratitude, GAlvira MondayMSN, FNP-BC
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
## 2022-06-27 NOTE — Assessment & Plan Note (Addendum)
The onset of symptoms a month ago C/o of nausea and reports taking Zofran with minimum relief of symptoms No changes in bowel or bladder No abdominal pain, fever, or chills Since his last visit, the patient has lost 3 lbs Will start the patient on Remeron 7.5 mg and f/u in 1 month Wt Readings from Last 3 Encounters:  06/27/22 121 lb 1.3 oz (54.9 kg)  05/14/22 124 lb 12.8 oz (56.6 kg)  05/13/22 124 lb (56.2 kg)

## 2022-06-27 NOTE — Assessment & Plan Note (Signed)
Reports cutting back to 1 ppd Will like a refill of his niction patch Refilled sent

## 2022-07-30 ENCOUNTER — Ambulatory Visit: Payer: Self-pay | Admitting: Family Medicine

## 2022-07-31 ENCOUNTER — Encounter: Payer: Self-pay | Admitting: Family Medicine

## 2022-08-29 ENCOUNTER — Ambulatory Visit: Payer: Self-pay | Admitting: Family Medicine

## 2022-08-29 ENCOUNTER — Encounter: Payer: Self-pay | Admitting: Family Medicine

## 2022-11-17 ENCOUNTER — Other Ambulatory Visit: Payer: Self-pay | Admitting: Family Medicine

## 2022-11-17 DIAGNOSIS — J4489 Other specified chronic obstructive pulmonary disease: Secondary | ICD-10-CM

## 2022-11-19 ENCOUNTER — Emergency Department (HOSPITAL_COMMUNITY)
Admission: EM | Admit: 2022-11-19 | Discharge: 2022-11-19 | Disposition: A | Discharge status: Home / Self Care | Payer: Self-pay | Attending: Emergency Medicine | Admitting: Emergency Medicine

## 2022-11-19 ENCOUNTER — Emergency Department (HOSPITAL_COMMUNITY): Payer: Self-pay

## 2022-11-19 ENCOUNTER — Encounter (HOSPITAL_COMMUNITY): Payer: Self-pay

## 2022-11-19 ENCOUNTER — Other Ambulatory Visit: Payer: Self-pay

## 2022-11-19 DIAGNOSIS — F17200 Nicotine dependence, unspecified, uncomplicated: Secondary | ICD-10-CM | POA: Insufficient documentation

## 2022-11-19 DIAGNOSIS — J441 Chronic obstructive pulmonary disease with (acute) exacerbation: Secondary | ICD-10-CM | POA: Insufficient documentation

## 2022-11-19 DIAGNOSIS — Z20822 Contact with and (suspected) exposure to covid-19: Secondary | ICD-10-CM | POA: Insufficient documentation

## 2022-11-19 DIAGNOSIS — J101 Influenza due to other identified influenza virus with other respiratory manifestations: Secondary | ICD-10-CM | POA: Insufficient documentation

## 2022-11-19 DIAGNOSIS — Z7952 Long term (current) use of systemic steroids: Secondary | ICD-10-CM | POA: Insufficient documentation

## 2022-11-19 DIAGNOSIS — Z7951 Long term (current) use of inhaled steroids: Secondary | ICD-10-CM | POA: Insufficient documentation

## 2022-11-19 DIAGNOSIS — Z720 Tobacco use: Secondary | ICD-10-CM

## 2022-11-19 LAB — RESP PANEL BY RT-PCR (RSV, FLU A&B, COVID)  RVPGX2
Influenza A by PCR: POSITIVE — AB
Influenza B by PCR: NEGATIVE
Resp Syncytial Virus by PCR: NEGATIVE
SARS Coronavirus 2 by RT PCR: NEGATIVE

## 2022-11-19 MED ORDER — IPRATROPIUM-ALBUTEROL 0.5-2.5 (3) MG/3ML IN SOLN
3.0000 mL | Freq: Once | RESPIRATORY_TRACT | Status: AC
Start: 2022-11-19 — End: 2022-11-19
  Administered 2022-11-19: 3 mL via RESPIRATORY_TRACT
  Filled 2022-11-19: qty 3

## 2022-11-19 MED ORDER — METHYLPREDNISOLONE SODIUM SUCC 125 MG IJ SOLR
INTRAMUSCULAR | Status: AC
  Administered 2022-11-19: 125 mg via INTRAVENOUS
  Filled 2022-11-19: qty 2

## 2022-11-19 MED ORDER — ACETAMINOPHEN 500 MG PO TABS
1000.0000 mg | ORAL_TABLET | Freq: Once | ORAL | Status: AC
  Administered 2022-11-19: 1000 mg via ORAL
  Filled 2022-11-19: qty 2

## 2022-11-19 MED ORDER — ALBUTEROL SULFATE HFA 108 (90 BASE) MCG/ACT IN AERS
1.0000 | INHALATION_SPRAY | RESPIRATORY_TRACT | Status: DC | PRN
  Filled 2022-11-19: qty 6.7

## 2022-11-19 MED ORDER — AEROCHAMBER Z-STAT PLUS/MEDIUM MISC
1.0000 | Freq: Once | Status: AC
  Administered 2022-11-19: 1
  Filled 2022-11-19: qty 1

## 2022-11-19 MED ORDER — OSELTAMIVIR PHOSPHATE 75 MG PO CAPS: 75.0000 mg | ORAL_CAPSULE | Freq: Two times a day (BID) | ORAL | 0 refills | Status: AC

## 2022-11-19 MED ORDER — METHYLPREDNISOLONE SODIUM SUCC 125 MG IJ SOLR
125.0000 mg | Freq: Once | INTRAMUSCULAR | Status: AC
Start: 2022-11-19 — End: 2022-11-19
  Filled 2022-11-19: qty 2

## 2022-11-19 MED ORDER — PREDNISONE 50 MG PO TABS: 50.0000 mg | ORAL_TABLET | Freq: Every day | ORAL | 0 refills | Status: DC

## 2022-11-19 NOTE — ED Triage Notes (Signed)
Pt has COPD and smokes 2.5-3 packs per day

## 2022-11-19 NOTE — Discharge Instructions (Addendum)
Try to stop smoking. °

## 2022-11-19 NOTE — ED Provider Notes (Signed)
[facility] EMERGENCY DEPARTMENT Provider Note   CSN: [id number] Arrival date & time: 11/19/22  1533     History  No chief complaint on file.   Scott Fitzgerald is a 56 y.o. male.  Pt is a 56 yo male with pmhx significant for alcohol abuse in the past, anxiety, copd, tobacco abuse and gerd.  Pt has been out of his inhaler and has been using otc primatene mist.  Pt went to the fire station complaining of sob.  They called EMS to bring him here due to sob.  Pt has had fever and cough.  Pt given a duoneb en route.       Home Medications Prior to Admission medications   Medication Sig Start Date End Date Taking? Authorizing Provider  oseltamivir (TAMIFLU) 75 MG capsule Take 1 capsule (75 mg total) by mouth every 12 (twelve) hours. 11/19/22  Yes Isla Pence, MD  predniSONE (DELTASONE) 50 MG tablet Take 1 tablet (50 mg total) by mouth daily with breakfast. 11/19/22  Yes Isla Pence, MD  albuterol (PROVENTIL) (2.5 MG/3ML) 0.083% nebulizer solution Take 3 mLs (2.5 mg total) by nebulization every 6 (six) hours as needed for wheezing or shortness of breath. 01/25/21   Noreene Larsson, NP  albuterol (VENTOLIN HFA) 108 (90 Base) MCG/ACT inhaler Inhale 2 puffs into the lungs every 6 (six) hours as needed for wheezing or shortness of breath. 05/14/22   Alvira Monday, FNP  Budeson-Glycopyrrol-Formoterol (BREZTRI AEROSPHERE) 160-9-4.8 MCG/ACT AERO Inhale 1 puff into the lungs as needed.    [provider]  busPIRone (BUSPAR) 7.5 MG tablet Take 1 tablet (7.5 mg total) by mouth 2 (two) times daily. 06/27/22   Alvira Monday, FNP  folic acid (FOLVITE) 1 MG tablet Take 1 tablet (1 mg total) by mouth daily. 06/27/22   Alvira Monday, FNP  HYDROcodone-acetaminophen (NORCO) 10-325 MG tablet Take 1 tablet by mouth every 6 (six) hours as needed for up to 15 doses for severe pain. Patient not taking: Reported on 06/27/2022 05/14/22   Alvira Monday, FNP  ibuprofen (ADVIL) 200 MG tablet Take 800  mg by mouth 2 (two) times daily.    [provider]  metoCLOPramide (REGLAN) 5 MG tablet Take 1 tablet (5 mg total) by mouth 4 (four) times daily. 06/27/22   Alvira Monday, FNP  mirtazapine (REMERON) 7.5 MG tablet Take 1 tablet (7.5 mg total) by mouth at bedtime. 06/27/22   Alvira Monday, FNP  mometasone-formoterol (DULERA) 200-5 MCG/ACT AERO Inhale 2 puffs into the lungs as needed.    [provider]  naproxen (NAPROSYN) 500 MG tablet Take 1 tablet (500 mg total) by mouth 2 (two) times daily with a meal. 04/24/22   Alvira Monday, FNP  nicotine (NICODERM CQ - DOSED IN MG/24 HOURS) 21 mg/24hr patch Place 1 patch (21 mg total) onto the skin daily. 06/27/22   Alvira Monday, FNP  nicotine polacrilex (NICORETTE) 4 MG gum Take 1 each (4 mg total) by mouth as needed for smoking cessation. 05/08/22   Herbert Pun, MD  ondansetron (ZOFRAN) 4 MG tablet Take 1 tablet (4 mg total) by mouth every 8 (eight) hours as needed for nausea or vomiting. 05/14/22   Alvira Monday, FNP  senna (SENOKOT) 8.6 MG tablet Take 1 tablet (8.6 mg total) by mouth daily. 05/14/22   Alvira Monday, FNP  thiamine (VITAMIN B1) 100 MG tablet Take 1 tablet (100 mg total) by mouth daily. 06/27/22   Alvira Monday, FNP      Allergies  Patient has no known allergies.    Review of Systems   Review of Systems  Constitutional:  Positive for fever.  Respiratory:  Positive for cough, shortness of breath and wheezing.   All other systems reviewed and are negative.   Physical Exam Updated Vital Signs BP 127/89   Pulse 93   Temp 97.6 F (36.4 C) (Oral)   Resp 15   Ht '5\' 5"'$  (1.651 m)   Wt 54.4 kg   SpO2 98%   BMI 19.97 kg/m  Physical Exam Vitals and nursing note reviewed.  Constitutional:      Appearance: He is ill-appearing.  HENT:     Head: Normocephalic and atraumatic.     Right Ear: External ear normal.     Left Ear: External ear normal.     Nose: Nose normal.     Mouth/Throat:     Mouth:  Mucous membranes are dry.  Eyes:     Extraocular Movements: Extraocular movements intact.     Conjunctiva/sclera: Conjunctivae normal.     Pupils: Pupils are equal, round, and reactive to light.  Cardiovascular:     Rate and Rhythm: Normal rate and regular rhythm.     Pulses: Normal pulses.     Heart sounds: Normal heart sounds.  Pulmonary:     Breath sounds: Wheezing present.  Abdominal:     General: Abdomen is flat. Bowel sounds are normal.     Palpations: Abdomen is soft.  Musculoskeletal:        General: Normal range of motion.     Cervical back: Normal range of motion and neck supple.  Skin:    General: Skin is warm.     Capillary Refill: Capillary refill takes less than 2 seconds.  Neurological:     General: No focal deficit present.     Mental Status: He is alert and oriented to person, place, and time.  Psychiatric:        Mood and Affect: Mood normal.        Behavior: Behavior normal.     ED Results / Procedures / Treatments   Labs (all labs ordered are listed, but only abnormal results are displayed) Labs Reviewed  RESP PANEL BY RT-PCR (RSV, FLU A&B, COVID)  RVPGX2 - Abnormal; Notable for the following components:      Result Value   Influenza A by PCR POSITIVE (*)    All other components within normal limits    EKG EKG Interpretation  Date/Time:  Tuesday November 19 2022 15:45:06 EST Ventricular Rate:  94 PR Interval:  168 QRS Duration: 76 QT Interval:  358 QTC Calculation: 447 R Axis:   91 Text Interpretation: Normal sinus rhythm Rightward axis Borderline ECG When compared with ECG of 20-Mar-2022 15:19, PREVIOUS ECG IS PRESENT No significant change since last tracing Confirmed by Isla Pence [phone]) on 11/19/2022 9:44:27 PM  Radiology DG Chest 2 View  Result Date: 11/19/2022 CLINICAL DATA:  Shortness of breath. EXAM: CHEST - 2 VIEW COMPARISON:  March 20, 2022. FINDINGS: The heart size and mediastinal contours are within normal limits. Both lungs  are clear. The visualized skeletal structures are unremarkable. IMPRESSION: No active cardiopulmonary disease. Electronically Signed   By: Marijo Conception M.D.   On: 11/19/2022 16:24    Procedures Procedures    Medications Ordered in ED Medications  albuterol (VENTOLIN HFA) 108 (90 Base) MCG/ACT inhaler 1-2 puff (has no administration in time range)  ipratropium-albuterol (DUONEB) 0.5-2.5 (3) MG/3ML nebulizer solution 3 mL (3  mLs Nebulization Given 11/19/22 2032)  methylPREDNISolone sodium succinate (SOLU-MEDROL) 125 mg/2 mL injection 125 mg (125 mg Intravenous Given 11/19/22 2126)  aerochamber Z-Stat Plus/medium 1 each (1 each Other Given 11/19/22 2047)  acetaminophen (TYLENOL) tablet 1,000 mg (1,000 mg Oral Given 11/19/22 2045)    ED Course/ Medical Decision Making/ A&P                           Medical Decision Making Amount and/or Complexity of Data Reviewed Radiology: ordered.  Risk OTC drugs. Prescription drug management.   This patient presents to the ED for concern of sob, this involves an extensive number of treatment options, and is a complaint that carries with it a high risk of complications and morbidity.  The differential diagnosis includes copd exac, pneumonia, covid/flu/rsv   Co morbidities that complicate the patient evaluation  alcohol abuse in the past, anxiety, copd, tobacco abuse and gerd   Additional history obtained:  Additional history obtained from epic chart review    Lab Tests:  I Ordered, and personally interpreted labs.  The pertinent results include:  flu A +   Imaging Studies ordered:  I ordered imaging studies including cxr  I independently visualized and interpreted imaging which showed No active cardiopulmonary disease.  I agree with the radiologist interpretation   Cardiac Monitoring:  The patient was maintained on a cardiac monitor.  I personally viewed and interpreted the cardiac monitored which showed an underlying rhythm  of: nsr   Medicines ordered and prescription drug management:  I ordered medication including albuterol/solumedrol  for sob  Reevaluation of the patient after these medicines showed that the patient improved I have reviewed the patients home medicines and have made adjustments as needed   Problem List / ED Course:  Influenza A:  pt with sob due to flu and copd.  Pt given an albuterol inhaler + spacer prior to d/c.  He threw away the primatene mist.  Pt is able to ambulate without any problems.  O2 sat stays excellent.  Pt is stable for d/c.  He is instructed to return if worse.  Tobacco abuse:  pt encouraged to quit   Reevaluation:  After the interventions noted above, I reevaluated the patient and found that they have :improved   Social Determinants of Health:  Lives at home/ no insurance/ no pcp   Dispostion:  After consideration of the diagnostic results and the patients response to treatment, I feel that the patent would benefit from discharge with outpatient f/u.          Final Clinical Impression(s) / ED Diagnoses Final diagnoses:  Influenza A  COPD exacerbation ([place])  Tobacco abuse    Rx / DC Orders ED Discharge Orders          Ordered    oseltamivir (TAMIFLU) 75 MG capsule  Every 12 hours        11/19/22 2151    predniSONE (DELTASONE) 50 MG tablet  Daily with breakfast        11/19/22 2151              Isla Pence, MD 11/19/22 2153
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
## 2022-11-19 NOTE — ED Notes (Signed)
ED Provider at bedside. 

## 2022-11-19 NOTE — ED Notes (Signed)
ED pyxis is out of Solu-medrol.  AC called to bring solu-medrol

## 2022-11-19 NOTE — ED Notes (Signed)
Inhaler and spacer sent home with pt.  Pt has been instructed on how and when to use it

## 2022-11-19 NOTE — Progress Notes (Signed)
Patient given albuterol inhaler and spacer and instructed on use.

## 2022-11-19 NOTE — ED Notes (Signed)
FT at bedside giving tx

## 2022-11-19 NOTE — ED Notes (Signed)
Pt ambulated to the bathroom, with mask on, unassisted.

## 2022-12-09 ENCOUNTER — Emergency Department: Payer: Self-pay

## 2022-12-09 ENCOUNTER — Emergency Department
Admission: EM | Admit: 2022-12-09 | Discharge: 2022-12-09 | Disposition: A | Discharge status: Home / Self Care | Payer: Self-pay | Attending: Student in an Organized Health Care Education/Training Program | Admitting: Student in an Organized Health Care Education/Training Program

## 2022-12-09 ENCOUNTER — Encounter: Payer: Self-pay | Admitting: Emergency Medicine

## 2022-12-09 ENCOUNTER — Other Ambulatory Visit: Payer: Self-pay

## 2022-12-09 DIAGNOSIS — Z87891 Personal history of nicotine dependence: Secondary | ICD-10-CM | POA: Insufficient documentation

## 2022-12-09 DIAGNOSIS — J441 Chronic obstructive pulmonary disease with (acute) exacerbation: Secondary | ICD-10-CM | POA: Insufficient documentation

## 2022-12-09 LAB — CBC
HCT: 35.5 % — ABNORMAL LOW (ref 39.0–52.0)
Hemoglobin: 12.1 g/dL — ABNORMAL LOW (ref 13.0–17.0)
MCH: 33.9 pg (ref 26.0–34.0)
MCHC: 34.1 g/dL (ref 30.0–36.0)
MCV: 99.4 fL (ref 80.0–100.0)
Platelets: 260 10*3/uL (ref 150–400)
RBC: 3.57 MIL/uL — ABNORMAL LOW (ref 4.22–5.81)
RDW: 13.7 % (ref 11.5–15.5)
WBC: 6 10*3/uL (ref 4.0–10.5)
nRBC: 0 % (ref 0.0–0.2)

## 2022-12-09 LAB — COMPREHENSIVE METABOLIC PANEL
ALT: 18 U/L (ref 0–44)
AST: 26 U/L (ref 15–41)
Albumin: 3.8 g/dL (ref 3.5–5.0)
Alkaline Phosphatase: 46 U/L (ref 38–126)
Anion gap: 9 (ref 5–15)
BUN: 7 mg/dL (ref 6–20)
CO2: 22 mmol/L (ref 22–32)
Calcium: 9.2 mg/dL (ref 8.9–10.3)
Chloride: 107 mmol/L (ref 98–111)
Creatinine, Ser: 0.72 mg/dL (ref 0.61–1.24)
GFR, Estimated: >60 mL/min (ref >60)
Glucose, Bld: 116 mg/dL — ABNORMAL HIGH (ref 70–99)
Potassium: 3.8 mmol/L (ref 3.5–5.1)
Sodium: 138 mmol/L (ref 135–145)
Total Bilirubin: 0.5 mg/dL (ref 0.3–1.2)
Total Protein: 7.2 g/dL (ref 6.5–8.1)

## 2022-12-09 LAB — BLOOD GAS, VENOUS
Acid-Base Excess: 0.5 mmol/L (ref 0.0–2.0)
Bicarbonate: 24.6 mmol/L (ref 20.0–28.0)
O2 Saturation: 97.2 %
Patient temperature: 37
pCO2, Ven: 37 mmHg — ABNORMAL LOW (ref 44–60)
pH, Ven: 7.43 (ref 7.25–7.43)
pO2, Ven: 75 mmHg — ABNORMAL HIGH (ref 32–45)

## 2022-12-09 MED ORDER — DOXYCYCLINE HYCLATE 100 MG PO TABS: 100.0000 mg | ORAL_TABLET | Freq: Two times a day (BID) | ORAL | 0 refills | Status: AC

## 2022-12-09 MED ORDER — IPRATROPIUM-ALBUTEROL 0.5-2.5 (3) MG/3ML IN SOLN
3.0000 mL | Freq: Once | RESPIRATORY_TRACT | Status: AC
  Administered 2022-12-09: 3 mL via RESPIRATORY_TRACT
  Filled 2022-12-09: qty 3

## 2022-12-09 MED ORDER — METHYLPREDNISOLONE SODIUM SUCC 125 MG IJ SOLR
125.0000 mg | Freq: Once | INTRAMUSCULAR | Status: AC
  Administered 2022-12-09: 125 mg via INTRAVENOUS
  Filled 2022-12-09: qty 2

## 2022-12-09 MED ORDER — PREDNISONE 20 MG PO TABS: 40.0000 mg | ORAL_TABLET | Freq: Every day | ORAL | 0 refills | Status: AC

## 2022-12-09 MED ORDER — IOHEXOL 350 MG/ML SOLN
100.0000 mL | Freq: Once | INTRAVENOUS | Status: AC | PRN
  Administered 2022-12-09: 100 mL via INTRAVENOUS

## 2022-12-09 NOTE — ED Triage Notes (Signed)
Pt sts that he has COPD and was in the hospital about two weeks ago for the same think at [facility]. Pt sts that he has used his albuterol in hailer and nebulizer treatments at the house and nothing is helping.

## 2022-12-09 NOTE — ED Provider Notes (Signed)
[facility] Provider Note    Event Date/Time   First MD Initiated Contact with Patient 12/09/22 1501     (approximate)   History   Shortness of Breath   HPI  Scott Fitzgerald is a 57 y.o. male with a history of COPD and heavy daily smoking presents to the ER for evaluation of worsening shortness of breath.  Patient recently seen at [place] diagnosed with flu.  Give nebulizer treatments.  Felt improved for short period but then got worse.  Does not have any fevers.  No productive cough.  Denies any recent steroid use.  No nausea or vomiting.  No chest pain.  Feels fatigued.     Physical Exam   Triage Vital Signs: ED Triage Vitals  Enc Vitals Group     BP 12/09/22 1407 116/88     Pulse Rate 12/09/22 1407 89     Resp 12/09/22 1407 17     Temp 12/09/22 1407 98.6 F (37 C)     Temp Source 12/09/22 1407 Oral     SpO2 12/09/22 1407 97 %     Weight 12/09/22 1408 120 lb (54.4 kg)     Height 12/09/22 1450 '5\' 5"'$  (1.651 m)     Head Circumference --      Peak Flow --      Pain Score 12/09/22 1408 0     Pain Loc --      Pain Edu? --      Excl. in [place]? --     Most recent vital signs: Vitals:   12/09/22 1407  BP: 116/88  Pulse: 89  Resp: 17  Temp: 98.6 F (37 C)  SpO2: 97%     Constitutional: Alert  Eyes: Conjunctivae are normal.  Head: Atraumatic. Nose: No congestion/rhinnorhea. Mouth/Throat: Mucous membranes are moist.   Neck: Painless ROM.  Cardiovascular:   Good peripheral circulation. Respiratory: Normal respiratory effort.  Scattered expiratory wheeze throughout. Gastrointestinal: Soft and nontender.  Musculoskeletal:  no deformity Neurologic:  MAE spontaneously. No gross focal neurologic deficits are appreciated.  Skin:  Skin is warm, dry and intact. No rash noted. Psychiatric: Mood and affect are normal. Speech and behavior are normal.    ED Results / Procedures / Treatments   Labs (all labs ordered are listed, but only  abnormal results are displayed) Labs Reviewed  CBC - Abnormal; Notable for the following components:      Result Value   RBC 3.57 (*)    Hemoglobin 12.1 (*)    HCT 35.5 (*)    All other components within normal limits  COMPREHENSIVE METABOLIC PANEL - Abnormal; Notable for the following components:   Glucose, Bld 116 (*)    All other components within normal limits  BLOOD GAS, VENOUS - Abnormal; Notable for the following components:   pCO2, Ven 37 (*)    pO2, Ven 75 (*)    All other components within normal limits     EKG  ED ECG REPORT I, Merlyn Lot, the attending physician, personally viewed and interpreted this ECG.   Date: 12/09/2022  EKG Time: 14:13  Rate: 90  Rhythm: sinus  Axis: right  Intervals: normal  ST&T Change: no stemi, no depressions    RADIOLOGY Please see ED Course for my review and interpretation.  I personally reviewed all radiographic images ordered to evaluate for the above acute complaints and reviewed radiology reports and findings.  These findings were personally discussed with the patient.  Please see medical  record for radiology report.    PROCEDURES:  Critical Care performed: No  Procedures   MEDICATIONS ORDERED IN ED: Medications  methylPREDNISolone sodium succinate (SOLU-MEDROL) 125 mg/2 mL injection 125 mg (125 mg Intravenous Given 12/09/22 1548)  ipratropium-albuterol (DUONEB) 0.5-2.5 (3) MG/3ML nebulizer solution 3 mL (3 mLs Nebulization Given 12/09/22 1548)  iohexol (OMNIPAQUE) 350 MG/ML injection 100 mL (100 mLs Intravenous Contrast Given 12/09/22 1602)     IMPRESSION / MDM / ASSESSMENT AND PLAN / ED COURSE  I reviewed the triage vital signs and the nursing notes.                              Differential diagnosis includes, but is not limited to, Asthma, copd, CHF, pna, ptx, malignancy, Pe, anemia  Patient presenting to the ER for evaluation of symptoms as described above.  Based on symptoms, risk factors and  considered above differential, this presenting complaint could reflect a potentially life-threatening illness therefore the patient will be placed on continuous pulse oximetry and telemetry for monitoring.  Laboratory evaluation will be sent to evaluate for the above complaints.  Patient's exam is most consistent with COPD exacerbation complicated by recent wound infection.  Will give nebulizer will give Solu-Medrol.  Will reassess.   Clinical Course as of 12/09/22 1633  Mon Dec 09, 2022  1546 Chest x-ray on my review and interpretation without evidence of consolidation or pneumothorax. [PR]  6294 CTA without any evidence of PE. [PR]  1629 Patient ambulating with steady gait no hypoxia.  He is satting well.  Feels improved after nebulizer.  Will cover with doxycycline given findings of pneumonia on CT for possible postviral infection.  Will give course of prednisone.  Patient does appear stable and appropriate for outpatient follow-up. [PR]    Clinical Course User Index [PR] Merlyn Lot, MD     FINAL CLINICAL IMPRESSION(S) / ED DIAGNOSES   Final diagnoses:  COPD exacerbation ([place])     Rx / DC Orders   ED Discharge Orders          Ordered    doxycycline (VIBRA-TABS) 100 MG tablet  2 times daily        12/09/22 1631    predniSONE (DELTASONE) 20 MG tablet  Daily        12/09/22 1631             Note:  This document was prepared using Dragon voice recognition software and may include unintentional dictation errors.    Merlyn Lot, MD 12/09/22 [phone]

## 2023-06-06 ENCOUNTER — Other Ambulatory Visit: Payer: Self-pay

## 2023-06-06 ENCOUNTER — Emergency Department
Admission: EM | Admit: 2023-06-06 | Discharge: 2023-06-06 | Disposition: A | Discharge status: Home / Self Care | Payer: Self-pay | Attending: Emergency Medicine | Admitting: Emergency Medicine

## 2023-06-06 DIAGNOSIS — F10230 Alcohol dependence with withdrawal, uncomplicated: Secondary | ICD-10-CM | POA: Insufficient documentation

## 2023-06-06 DIAGNOSIS — N3000 Acute cystitis without hematuria: Secondary | ICD-10-CM | POA: Insufficient documentation

## 2023-06-06 DIAGNOSIS — J45909 Unspecified asthma, uncomplicated: Secondary | ICD-10-CM | POA: Insufficient documentation

## 2023-06-06 DIAGNOSIS — F10930 Alcohol use, unspecified with withdrawal, uncomplicated: Secondary | ICD-10-CM

## 2023-06-06 LAB — COMPREHENSIVE METABOLIC PANEL
ALT: 48 U/L — ABNORMAL HIGH (ref 0–44)
AST: 78 U/L — ABNORMAL HIGH (ref 15–41)
Albumin: 4.3 g/dL (ref 3.5–5.0)
Alkaline Phosphatase: 60 U/L (ref 38–126)
Anion gap: 15 (ref 5–15)
BUN: 11 mg/dL (ref 6–20)
CO2: 19 mmol/L — ABNORMAL LOW (ref 22–32)
Calcium: 9.3 mg/dL (ref 8.9–10.3)
Chloride: 103 mmol/L (ref 98–111)
Creatinine, Ser: 0.82 mg/dL (ref 0.61–1.24)
GFR, Estimated: >60 mL/min (ref >60)
Glucose, Bld: 126 mg/dL — ABNORMAL HIGH (ref 70–99)
Potassium: 4 mmol/L (ref 3.5–5.1)
Sodium: 137 mmol/L (ref 135–145)
Total Bilirubin: 0.6 mg/dL (ref 0.3–1.2)
Total Protein: 7.3 g/dL (ref 6.5–8.1)

## 2023-06-06 LAB — CBC
HCT: 42 % (ref 39.0–52.0)
Hemoglobin: 14.3 g/dL (ref 13.0–17.0)
MCH: 33.2 pg (ref 26.0–34.0)
MCHC: 34 g/dL (ref 30.0–36.0)
MCV: 97.4 fL (ref 80.0–100.0)
Platelets: 226 10*3/uL (ref 150–400)
RBC: 4.31 MIL/uL (ref 4.22–5.81)
RDW: 11.8 % (ref 11.5–15.5)
WBC: 5.3 10*3/uL (ref 4.0–10.5)
nRBC: 0 % (ref 0.0–0.2)

## 2023-06-06 LAB — LIPASE, BLOOD: Lipase: 39 U/L (ref 11–51)

## 2023-06-06 LAB — URINALYSIS, ROUTINE W REFLEX MICROSCOPIC
Bilirubin Urine: NEGATIVE
Glucose, UA: NEGATIVE mg/dL
Hgb urine dipstick: NEGATIVE
Ketones, ur: 5 mg/dL — AB
Nitrite: POSITIVE — AB
Protein, ur: NEGATIVE mg/dL
Specific Gravity, Urine: 1.013 (ref 1.005–1.030)
WBC, UA: >50 WBC/hpf (ref 0–5)
pH: 5 (ref 5.0–8.0)

## 2023-06-06 MED ORDER — CHLORDIAZEPOXIDE HCL 25 MG PO CAPS
50.0000 mg | ORAL_CAPSULE | Freq: Once | ORAL | Status: AC
  Administered 2023-06-06: 50 mg via ORAL
  Filled 2023-06-06: qty 2

## 2023-06-06 MED ORDER — SODIUM CHLORIDE 0.9 % IV BOLUS
1000.0000 mL | Freq: Once | INTRAVENOUS | Status: AC
  Administered 2023-06-06: 1000 mL via INTRAVENOUS

## 2023-06-06 MED ORDER — CEPHALEXIN 500 MG PO CAPS: 500.0000 mg | ORAL_CAPSULE | Freq: Four times a day (QID) | ORAL | 0 refills | Status: AC

## 2023-06-06 MED ORDER — SODIUM CHLORIDE 0.9 % IV SOLN
2.0000 g | Freq: Once | INTRAVENOUS | Status: AC
  Administered 2023-06-06: 2 g via INTRAVENOUS
  Filled 2023-06-06: qty 20

## 2023-06-06 MED ORDER — CHLORDIAZEPOXIDE HCL 25 MG PO CAPS: 25.0000 mg | ORAL_CAPSULE | Freq: Two times a day (BID) | ORAL | 0 refills | Status: DC

## 2023-06-06 MED ORDER — ONDANSETRON HCL 4 MG/2ML IJ SOLN
4.0000 mg | Freq: Once | INTRAMUSCULAR | Status: AC
  Administered 2023-06-06: 4 mg via INTRAVENOUS
  Filled 2023-06-06: qty 2

## 2023-06-06 NOTE — ED Provider Notes (Signed)
[facility] Provider Note  Patient Contact: 8:55 PM (approximate)   History   Emesis (yesterday)   HPI  Scott Fitzgerald is a 57 y.o. male with a history of alcohol use, anxiety, asthma, GERD and prior UTIs, presents to the emergency department with chills.  Patient states that he has been trying to cut back on alcohol and he does notice hand tremors more when he is drinking less.  He states that he had 1 episode of vomiting yesterday but has had no nausea or abdominal pain.  He denies chest pain, chest tightness or shortness of breath.  Patient is concerned that he might be having some mild alcohol withdrawal symptoms.  No seizure-like activity at home.  No headaches.      Physical Exam   Triage Vital Signs: ED Triage Vitals  Encounter Vitals Group     BP 06/06/23 1817 121/73     Systolic BP Percentile --      Diastolic BP Percentile --      Pulse Rate 06/06/23 1817 86     Resp 06/06/23 1817 20     Temp 06/06/23 1817 98 F (36.7 C)     Temp Source 06/06/23 1817 Oral     SpO2 06/06/23 1817 95 %     Weight 06/06/23 1815 130 lb (59 kg)     Height 06/06/23 1815 5\' 5"  (1.651 m)     Head Circumference --      Peak Flow --      Pain Score 06/06/23 1815 0     Pain Loc --      Pain Education --      Exclude from Growth Chart --     Most recent vital signs: Vitals:   06/06/23 1817  BP: 121/73  Pulse: 86  Resp: 20  Temp: 98 F (36.7 C)  SpO2: 95%     General: Alert and in no acute distress. Eyes:  PERRL. EOMI. Head: No acute traumatic findings ENT:      Nose: No congestion/rhinnorhea.      Mouth/Throat: Mucous membranes are moist. Neck: No stridor. No cervical spine tenderness to palpation. Cardiovascular:  Good peripheral perfusion Respiratory: Normal respiratory effort without tachypnea or retractions. Lungs CTAB. Good air entry to the bases with no decreased or absent breath sounds. Gastrointestinal: Bowel sounds 4 quadrants. Soft  and nontender to palpation. No guarding or rigidity. No palpable masses. No distention. No CVA tenderness. Musculoskeletal: Full range of motion to all extremities.  Neurologic:  No gross focal neurologic deficits are appreciated.  Skin:   No rash noted    ED Results / Procedures / Treatments   Labs (all labs ordered are listed, but only abnormal results are displayed) Labs Reviewed  COMPREHENSIVE METABOLIC PANEL - Abnormal; Notable for the following components:      Result Value   CO2 19 (*)    Glucose, Bld 126 (*)    AST 78 (*)    ALT 48 (*)    All other components within normal limits  URINALYSIS, ROUTINE W REFLEX MICROSCOPIC - Abnormal; Notable for the following components:   Color, Urine YELLOW (*)    APPearance CLOUDY (*)    Ketones, ur 5 (*)    Nitrite POSITIVE (*)    Leukocytes,Ua LARGE (*)    Bacteria, UA RARE (*)    All other components within normal limits  URINE CULTURE  LIPASE, BLOOD  CBC         PROCEDURES:  Critical Care performed: No  Procedures   MEDICATIONS ORDERED IN ED: Medications  cefTRIAXone (ROCEPHIN) 2 g in sodium chloride 0.9 % 100 mL IVPB (2 g Intravenous New Bag/Given 06/06/23 2028)  sodium chloride 0.9 % bolus 1,000 mL (1,000 mLs Intravenous New Bag/Given 06/06/23 1927)  ondansetron (ZOFRAN) injection 4 mg (4 mg Intravenous Given 06/06/23 1927)  chlordiazePOXIDE (LIBRIUM) capsule 50 mg (50 mg Oral Given 06/06/23 2007)     IMPRESSION / MDM / ASSESSMENT AND PLAN / ED COURSE  I reviewed the triage vital signs and the nursing notes.                              Assessment and plan Alcohol withdrawal UTI 57 year old male presents to the emergency department with concern for chills and 1 episode of vomiting that occurred yesterday.  On exam, patient is alert, active and nontoxic-appearing.  His vital signs are reassuring at triage.  CMP indicates elevated AST and ALT that are mild in nature.  CBC is within reference range.  Urinalysis  indicates a nitrate positive UTI.  Urine culture in process.  I discussed patient case with attending, Dr. Westley Hummer and we agreed to initiate Librium in the emergency department.  Patient was given Rocephin for possible UTI/pyelonephritis and was discharged with Keflex as well as a 5-day course of Librium.  Patient feels very comfortable with this plan and return precautions were given to return with new or worsening symptoms.     FINAL CLINICAL IMPRESSION(S) / ED DIAGNOSES   Final diagnoses:  Acute cystitis without hematuria  Alcohol withdrawal syndrome without complication (HCC)     Rx / DC Orders   ED Discharge Orders          Ordered    cephALEXin (KEFLEX) 500 MG capsule  4 times daily        06/06/23 2042    chlordiazePOXIDE (LIBRIUM) 25 MG capsule  2 times daily        06/06/23 2042             Note:  This document was prepared using Dragon voice recognition software and may include unintentional dictation errors.   Pia Mau [place], PA-C 06/06/23 2952    Dionne Bucy, MD 06/07/23 1116

## 2023-06-06 NOTE — Discharge Instructions (Addendum)
You can take Librium up to twice daily. You can take Keflex four times daily for the next seven days.

## 2023-06-06 NOTE — ED Triage Notes (Signed)
Pt to ed from home via acems for weakness and vomiting. Pt has been drinking ETOH today as well. Pt has HX of COPD.  20G LFA 4mg  of IV Zofran.  130/90 90 HR" 95 HR 151 BGL 97.8T 12Lead WNL  Pt is caxo4, in no acute distress in triage. Pt started feeling bad last Thursday.

## 2023-06-08 LAB — URINE CULTURE

## 2023-06-22 ENCOUNTER — Emergency Department
Admission: EM | Admit: 2023-06-22 | Discharge: 2023-06-22 | Disposition: A | Discharge status: Home / Self Care | Payer: Self-pay | Attending: Emergency Medicine | Admitting: Emergency Medicine

## 2023-06-22 ENCOUNTER — Other Ambulatory Visit: Payer: Self-pay

## 2023-06-22 DIAGNOSIS — F1029 Alcohol dependence with unspecified alcohol-induced disorder: Secondary | ICD-10-CM | POA: Insufficient documentation

## 2023-06-22 DIAGNOSIS — Y909 Presence of alcohol in blood, level not specified: Secondary | ICD-10-CM | POA: Insufficient documentation

## 2023-06-22 MED ORDER — ONDANSETRON 4 MG PO TBDP
4.0000 mg | ORAL_TABLET | Freq: Once | ORAL | Status: AC
  Administered 2023-06-22: 4 mg via ORAL
  Filled 2023-06-22: qty 1

## 2023-06-22 MED ORDER — ONDANSETRON 4 MG PO TBDP: ORAL_TABLET | ORAL | 0 refills | Status: DC

## 2023-06-22 MED ORDER — CHLORDIAZEPOXIDE HCL 5 MG PO CAPS
ORAL_CAPSULE | ORAL | 0 refills | Status: DC
Start: 2023-06-22 — End: 2023-07-05

## 2023-06-22 NOTE — ED Triage Notes (Signed)
Pt to ed from home via POV for DETOX. Pt was seen at [facility] on 7/24 for same and was prescribed chlordiazepoxide for a 4 day prescription. Pt is caox4, in no acute distress and ambulatory in triage. Pt denies any other symptoms other than "the shakes" which is baseline for the pt. Pt last drink of ETOH was Friday. Pt normally drinks 12 beers a day.

## 2023-06-22 NOTE — ED Provider Notes (Signed)
[facility] Provider Note    Event Date/Time   First MD Initiated Contact with Patient 06/22/23 [phone]     (approximate)   History   Detox (Withdrawals?)   HPI Scott Fitzgerald. is a 57 y.o. male who presents for possible alcohol withdrawal symptoms and requesting detox.  The patient reports that he was seen at [facility] as an outpatient several days ago and given a prescription for Librium which she has also received in the past from one of the [facility] providers.  He said that he has been doing well and has not had any alcohol since starting the Librium but this is only a few days ago and he said that he starts getting the shakes and becoming more anxious when he is not taking it.  He is concerned because he only has 1 dose of Librium left and he has no one with whom he can follow-up.  He thinks that he "cannot shake this thing" but feels that he needs additional medication or an inpatient stay to help him.  He denies chest pain or shortness of breath.  He denies fever.  Has had some nausea, no vomiting.  Denies lower abdominal pain.  Denies hallucinations.  No history of seizures.  States that he drinks about 12 beers a day, sometimes more.  However he denies drinking within the last 1+ week.     Physical Exam   Triage Vital Signs: ED Triage Vitals  Encounter Vitals Group     BP 06/22/23 0212 138/80     Systolic BP Percentile --      Diastolic BP Percentile --      Pulse Rate 06/22/23 0212 66     Resp 06/22/23 0212 16     Temp 06/22/23 0212 98 F (36.7 C)     Temp Source 06/22/23 0212 Oral     SpO2 06/22/23 0212 96 %     Weight 06/22/23 0213 58 kg (127 lb 13.9 oz)     Height 06/22/23 0213 1.651 m (5\' 5" )     Head Circumference --      Peak Flow --      Pain Score 06/22/23 0212 0     Pain Loc --      Pain Education --      Exclude from Growth Chart --     Most recent vital signs: Vitals:   06/22/23 0212 06/22/23 0331  BP: 138/80 124/81  Pulse: 66  71  Resp: 16 16  Temp: 98 F (36.7 C) 98.1 F (36.7 C)  SpO2: 96% 98%    General: Awake, no distress.  Somewhat disheveled but generally appropriate. CV:  Good peripheral perfusion.  Regular rate and rhythm. Resp:  Normal effort. Speaking easily and comfortably, no accessory muscle usage nor intercostal retractions.   Abd:  No distention.  Other:  No tremor appreciated.  Patient's mood and affect are normal under the circumstances.  No clinical evidence of intoxication.   ED Results / Procedures / Treatments   Labs (all labs ordered are listed, but only abnormal results are displayed) Labs Reviewed - No data to display     PROCEDURES:  Critical Care performed: No  Procedures    IMPRESSION / MDM / ASSESSMENT AND PLAN / ED COURSE  I reviewed the triage vital signs and the nursing notes.  Differential diagnosis includes, but is not limited to, alcohol withdrawal, ongoing alcohol abuse, electrolyte or metabolic abnormality.  Patient's presentation is most consistent with acute presentation with potential threat to life or bodily function.  Interventions/Medications given:  Medications  ondansetron (ZOFRAN-ODT) disintegrating tablet 4 mg (4 mg Oral Given 06/22/23 0326)    (Note:  hospital course my include additional interventions and/or labs/studies not listed above.)   Patient is hemodynamically stable with no signs or symptoms of intoxication or florid withdrawal at this time.  We had a long discussion about Librium and the appropriate usage.  He was only given a 4-day course given his long history of alcohol abuse, I suspect he would benefit from a much longer taper.  I explained to him that this cannot be an ongoing process and that he cannot return to the emergency department repeatedly for refills as that is not how Librium is supposed to work.  I gave him both residential treatment center resources and outpatient treatment center  resources and explained that we do not provide uncomplicated alcohol detox services within the [facility] health system.  I gave him a longer taper for Librium as well as a prescription for Zofran.  He asked if he could also have something "for my anxiety" but I explained that Librium will help with this and he cannot also get prescriptions for anxiety.  He was ambulatory without any difficulty and is agreeable to the plan.  Stable for discharge.         FINAL CLINICAL IMPRESSION(S) / ED DIAGNOSES   Final diagnoses:  Alcohol dependence with unspecified alcohol-induced disorder (HCC)     Rx / DC Orders   ED Discharge Orders          Ordered    chlordiazePOXIDE (LIBRIUM) 5 MG capsule        06/22/23 0319    Ambulatory Referral to Primary Care (Establish Care)        06/22/23 0319    ondansetron (ZOFRAN-ODT) 4 MG disintegrating tablet        06/22/23 0328             Note:  This document was prepared using Dragon voice recognition software and may include unintentional dictation errors.   Loleta Rose, MD 06/22/23 1556

## 2023-06-22 NOTE — Discharge Instructions (Signed)
You have been seen in the Emergency Department (ED) today for substance abuse.  You have been evaluated by the ED physician(s) and/or by the behavioral medicine specialists and are being discharged with outpatient resources and follow up recommendations.  Please return to the ED immediately if you have ANY thoughts of hurting yourself or anyone else, so that we may help you.  Please avoid alcohol and drug use.  Follow up with your doctor and/or therapist as soon as possible regarding today's ED  visit.   Please follow up any other recommendations and clinic appointments provided by the psychiatry team that saw you in the Emergency Department. 

## 2023-07-05 ENCOUNTER — Emergency Department
Admission: EM | Admit: 2023-07-05 | Discharge: 2023-07-05 | Disposition: A | Discharge status: Home / Self Care | Payer: Self-pay | Attending: Emergency Medicine | Admitting: Emergency Medicine

## 2023-07-05 ENCOUNTER — Telehealth: Payer: Self-pay | Admitting: Emergency Medicine

## 2023-07-05 ENCOUNTER — Other Ambulatory Visit: Payer: Self-pay

## 2023-07-05 DIAGNOSIS — J4489 Other specified chronic obstructive pulmonary disease: Secondary | ICD-10-CM

## 2023-07-05 DIAGNOSIS — J449 Chronic obstructive pulmonary disease, unspecified: Secondary | ICD-10-CM | POA: Insufficient documentation

## 2023-07-05 DIAGNOSIS — F1029 Alcohol dependence with unspecified alcohol-induced disorder: Secondary | ICD-10-CM | POA: Insufficient documentation

## 2023-07-05 MED ORDER — CHLORDIAZEPOXIDE HCL 5 MG PO CAPS: 5.0000 mg | ORAL_CAPSULE | Freq: Every day | ORAL | 0 refills | Status: AC

## 2023-07-05 MED ORDER — CHLORDIAZEPOXIDE HCL 5 MG PO CAPS
5.0000 mg | ORAL_CAPSULE | Freq: Every day | ORAL | 0 refills | Status: DC
Start: 2023-07-05 — End: 2023-07-05

## 2023-07-05 MED ORDER — ALBUTEROL SULFATE HFA 108 (90 BASE) MCG/ACT IN AERS
2.0000 | INHALATION_SPRAY | Freq: Four times a day (QID) | RESPIRATORY_TRACT | 0 refills | Status: AC | PRN
Start: 2023-07-05 — End: ?

## 2023-07-05 NOTE — ED Provider Notes (Signed)
[facility] Provider Note    Event Date/Time   First MD Initiated Contact with Patient 07/05/23 1223     (approximate)   History   Withdrawal   HPI  Scott Gehrman Jonluc Sneath. is a 57 y.o. male   relates a history of COPD, alcohol abuse anxiety  He was recently evaluated and given Librium.  He reports he is just recently completed the taper but is starting to feel anxious, desire to drink alcohol, and a bit shaky in his hands again.  He is requesting additional Librium.  He has not yet engaged with counseling, alcohol treatment services yet.  We discussed and developed a plan in conjunction together for him to follow-up with [facility] at their new facility.  On [employer] on Monday.  No confusion no nausea or vomiting no weakness.  No fevers or chills   Denies history of complicated withdrawal  Reports he has been abstinent from alcohol since July 29   Physical Exam   Triage Vital Signs: ED Triage Vitals  Encounter Vitals Group     BP 07/05/23 1211 124/70     Systolic BP Percentile --      Diastolic BP Percentile --      Pulse Rate 07/05/23 1211 64     Resp 07/05/23 1212 18     Temp 07/05/23 1212 98.3 F (36.8 C)     Temp Source 07/05/23 1212 Oral     SpO2 07/05/23 1212 98 %     Weight --      Height --      Head Circumference --      Peak Flow --      Pain Score 07/05/23 1213 0     Pain Loc --      Pain Education --      Exclude from Growth Chart --     Most recent vital signs: Vitals:   07/05/23 1212 07/05/23 1219  BP: 117/70 117/70  Pulse: 65 66  Resp: 18   Temp: 98.3 F (36.8 C)   SpO2: 98%      General: Awake, no distress.  Very pleasant CV:  Good peripheral perfusion.  Normal tones and rate Resp:  Normal effort.  Clear bilateral Abd:  No distention.  Other:  Very mild tremor in the fingers of the hands bilaterally when outstretched.  He is fully alert well-oriented pleasant in no distress.   ED Results / Procedures /  Treatments   Labs (all labs ordered are listed, but only abnormal results are displayed) Labs Reviewed - No data to display   EKG     RADIOLOGY     PROCEDURES:  Critical Care performed: No  Procedures   MEDICATIONS ORDERED IN ED: Medications - No data to display   IMPRESSION / MDM / ASSESSMENT AND PLAN / ED COURSE  I reviewed the triage vital signs and the nursing notes.                              Differential diagnosis includes, but is not limited to, probable underlying anxiety as well as desire to drink, also perhaps a very slight ongoing lingering element of alcohol withdrawal tremor.  This needs suspect that he is at low risk for severe or complicated withdrawal at this time given he has not had alcohol now for almost 2 weeks and has completed a taper.  He is comfortable by going back on  a low-dose of Librium for a few more days to complete additional time to taper off.  He will be following up with [facility] tomorrow.  He is not driving himself today and advises he will not drive while taking Librium    Patient's presentation is most consistent with acute, uncomplicated illness.  He is awake alert nontoxic well-appearing.    Return precautions and treatment recommendations and follow-up discussed with the patient who is agreeable with the plan.  At time of review of discharge plan patient also reports he just remembered that he would like a refill for albuterol.  He is not having active symptoms with his COPD and has not had a primary care to refill his as needed albuterol.  I will also send this prescription for him.  I also placed a referral to our primary care service which patient is interested  FINAL CLINICAL IMPRESSION(S) / ED DIAGNOSES   Final diagnoses:  Alcohol dependence with unspecified alcohol-induced disorder (HCC)     Rx / DC Orders   ED Discharge Orders          Ordered    chlordiazePOXIDE (LIBRIUM) 5 MG capsule  Daily at bedtime         07/05/23 1226    Ambulatory Referral to Primary Care (Establish Care)        07/05/23 1227    albuterol (VENTOLIN HFA) 108 (90 Base) MCG/ACT inhaler  Every 6 hours PRN        07/05/23 1231             Note:  This document was prepared using Dragon voice recognition software and may include unintentional dictation errors.   Sharyn Creamer, MD 07/05/23 1235

## 2023-07-05 NOTE — Discharge Instructions (Signed)
As we discussed, please follow-up with [facility].  However please note that they have moved to a new location on [address] in [place] here to the hospital.  Please follow-up with them Monday during their walk-in hours.  Return to the ER right away if you develop a seizure, confusion, severe tremors, vomiting, or other concerns arise.  No driving today or within 12 hours of use of Librium

## 2023-07-05 NOTE — ED Triage Notes (Addendum)
Pt c/o ETOH withdrawal and states he's out of withdrawal meds (Librium) x2 days. Pt is AOX4, NAD noted. Skin is dry, moderate tremor noted. Pt denies history of DT's.

## 2023-07-05 NOTE — Telephone Encounter (Signed)
Prescription canceled at [facility].  Sent to [facility], patient aware.  Pharmacy notified us that they are canceling the prescription in [place] as they are out of it, but it is available at [facility] location

## 2024-02-24 ENCOUNTER — Encounter: Payer: Self-pay | Admitting: Emergency Medicine

## 2024-02-24 ENCOUNTER — Other Ambulatory Visit: Payer: Self-pay

## 2024-02-24 ENCOUNTER — Emergency Department
Admission: EM | Admit: 2024-02-24 | Discharge: 2024-02-24 | Disposition: A | Discharge status: Home / Self Care | Payer: Self-pay | Attending: Emergency Medicine | Admitting: Emergency Medicine

## 2024-02-24 DIAGNOSIS — R110 Nausea: Secondary | ICD-10-CM

## 2024-02-24 DIAGNOSIS — N3000 Acute cystitis without hematuria: Secondary | ICD-10-CM

## 2024-02-24 LAB — COMPREHENSIVE METABOLIC PANEL WITH GFR
ALT: 20 U/L (ref 0–44)
AST: 30 U/L (ref 15–41)
Albumin: 4 g/dL (ref 3.5–5.0)
Alkaline Phosphatase: 66 U/L (ref 38–126)
Anion gap: 10 (ref 5–15)
BUN: 14 mg/dL (ref 6–20)
CO2: 24 mmol/L (ref 22–32)
Calcium: 9.3 mg/dL (ref 8.9–10.3)
Chloride: 102 mmol/L (ref 98–111)
Creatinine, Ser: 0.96 mg/dL (ref 0.61–1.24)
GFR, Estimated: >60 mL/min (ref >60)
Glucose, Bld: 121 mg/dL — ABNORMAL HIGH (ref 70–99)
Potassium: 4.2 mmol/L (ref 3.5–5.1)
Sodium: 136 mmol/L (ref 135–145)
Total Bilirubin: 0.7 mg/dL (ref 0.0–1.2)
Total Protein: 7.3 g/dL (ref 6.5–8.1)

## 2024-02-24 LAB — CBC
HCT: 39 % (ref 39.0–52.0)
Hemoglobin: 13.5 g/dL (ref 13.0–17.0)
MCH: 31.7 pg (ref 26.0–34.0)
MCHC: 34.6 g/dL (ref 30.0–36.0)
MCV: 91.5 fL (ref 80.0–100.0)
Platelets: 224 10*3/uL (ref 150–400)
RBC: 4.26 MIL/uL (ref 4.22–5.81)
RDW: 13.8 % (ref 11.5–15.5)
WBC: 7.3 10*3/uL (ref 4.0–10.5)
nRBC: 0 % (ref 0.0–0.2)

## 2024-02-24 LAB — URINALYSIS, ROUTINE W REFLEX MICROSCOPIC
Bilirubin Urine: NEGATIVE
Glucose, UA: NEGATIVE mg/dL
Ketones, ur: NEGATIVE mg/dL
Nitrite: NEGATIVE
Protein, ur: NEGATIVE mg/dL
Specific Gravity, Urine: 1.001 — ABNORMAL LOW (ref 1.005–1.030)
Squamous Epithelial / HPF: 0 /HPF (ref 0–5)
WBC, UA: >50 WBC/hpf (ref 0–5)
pH: 6 (ref 5.0–8.0)

## 2024-02-24 LAB — LIPASE, BLOOD: Lipase: 39 U/L (ref 11–51)

## 2024-02-24 MED ORDER — HYDROXYZINE HCL 25 MG PO TABS: 25.0000 mg | ORAL_TABLET | Freq: Three times a day (TID) | ORAL | 1 refills | Status: AC | PRN

## 2024-02-24 MED ORDER — ONDANSETRON 4 MG PO TBDP
4.0000 mg | ORAL_TABLET | Freq: Once | ORAL | Status: AC
  Administered 2024-02-24: 4 mg via ORAL
  Filled 2024-02-24: qty 1

## 2024-02-24 MED ORDER — CEPHALEXIN 500 MG PO CAPS
500.0000 mg | ORAL_CAPSULE | Freq: Once | ORAL | Status: AC
  Administered 2024-02-24: 500 mg via ORAL
  Filled 2024-02-24: qty 1

## 2024-02-24 MED ORDER — ONDANSETRON 4 MG PO TBDP: 4.0000 mg | ORAL_TABLET | Freq: Three times a day (TID) | ORAL | 0 refills | Status: DC | PRN

## 2024-02-24 MED ORDER — CEPHALEXIN 500 MG PO CAPS: 500.0000 mg | ORAL_CAPSULE | Freq: Three times a day (TID) | ORAL | 0 refills | Status: AC

## 2024-02-24 NOTE — ED Notes (Signed)
 Pt d/c home EDP order. Discharge summary reviewed, pt verbalizes understanding. Ambulatory off unit, NAD.

## 2024-02-24 NOTE — ED Provider Notes (Signed)
 [facility] Provider Note    Event Date/Time   First MD Initiated Contact with Patient 02/24/24 1349     (approximate)   History   Nausea   HPI  Curvin Hunger. is a 58 y.o. male who presents to the ED for evaluation of Nausea   I reviewed various ED visits for ethanol abuse.  Patient presents for 1 month of nausea and more acute hematuria and foul-smelling urine.  No fevers.  No abdominal pain.  No emesis.   Physical Exam   Triage Vital Signs: ED Triage Vitals [02/24/24 1128]  Encounter Vitals Group     BP 98/75     Systolic BP Percentile      Diastolic BP Percentile      Pulse Rate 87     Resp 18     Temp 97.7 F (36.5 C)     Temp Source Oral     SpO2 99 %     Weight 140 lb (63.5 kg)     Height 5\' 5"  (1.651 m)     Head Circumference      Peak Flow      Pain Score 0     Pain Loc      Pain Education      Exclude from Growth Chart     Most recent vital signs: Vitals:   02/24/24 1400 02/24/24 1501  BP: (!) 109/92   Pulse: 78   Resp: 18   Temp:  98.2 F (36.8 C)  SpO2: 98%     General: Awake, no distress.  CV:  Good peripheral perfusion.  Resp:  Normal effort.  Abd:  No distention.  Mild suprapubic tenderness, otherwise benign without tenderness or guarding MSK:  No deformity noted.  Neuro:  No focal deficits appreciated. Other:     ED Results / Procedures / Treatments   Labs (all labs ordered are listed, but only abnormal results are displayed) Labs Reviewed  COMPREHENSIVE METABOLIC PANEL WITH GFR - Abnormal; Notable for the following components:      Result Value   Glucose, Bld 121 (*)    All other components within normal limits  URINALYSIS, ROUTINE W REFLEX MICROSCOPIC - Abnormal; Notable for the following components:   Color, Urine STRAW (*)    APPearance HAZY (*)    Specific Gravity, Urine 1.001 (*)    Hgb urine dipstick SMALL (*)    Leukocytes,Ua LARGE (*)    Bacteria, UA FEW (*)    All other  components within normal limits  URINE CULTURE  LIPASE, BLOOD  CBC    EKG   RADIOLOGY   Official radiology report(s): No results found.  PROCEDURES and INTERVENTIONS:  Procedures  Medications  ondansetron (ZOFRAN-ODT) disintegrating tablet 4 mg (4 mg Oral Given 02/24/24 1502)  cephALEXin (KEFLEX) capsule 500 mg (500 mg Oral Given 02/24/24 1502)     IMPRESSION / MDM / ASSESSMENT AND PLAN / ED COURSE  I reviewed the triage vital signs and the nursing notes.  Differential diagnosis includes, but is not limited to, UTI, pyelo-, sepsis, alcoholic complication such as ketoacidosis or pancreatitis.  gonorrhea chlamydia  {Patient presents with symptoms of an acute illness or injury that is potentially life-threatening.  Patient presents with chronic nausea and acute urinary symptoms with signs of cystitis suitable for outpatient management with course of antibiotics.  No signs of sepsis or systemic illness.  Urine is sent for culture and he is started on antibiotics.  Normal CBC, lipase  and metabolic panel.  Discharged with antiemetics, hydroxyzine, Keflex and referral to PCP.      FINAL CLINICAL IMPRESSION(S) / ED DIAGNOSES   Final diagnoses:  Nausea  Acute cystitis without hematuria     Rx / DC Orders   ED Discharge Orders          Ordered    ondansetron (ZOFRAN-ODT) 4 MG disintegrating tablet  Every 8 hours PRN        02/24/24 1455    cephALEXin (KEFLEX) 500 MG capsule  3 times daily        02/24/24 1455    hydrOXYzine (ATARAX) 25 MG tablet  3 times daily PRN        02/24/24 1455    Ambulatory Referral to Primary Care (Establish Care)       Comments: Alcohol abuse, tobacco, multiple UTIs   02/24/24 1455             Note:  This document was prepared using Dragon voice recognition software and may include unintentional dictation errors.   Delton [place], MD 02/24/24 [phone]

## 2024-02-24 NOTE — ED Triage Notes (Signed)
 Patient to ED via POV for nausea x1 month. Pt reports knot on right side of chest. States he has mal smelling urine. Also might have had blood in his urine one day last week. States hx of whole in bladder. States he stopped smoking around the time this started.

## 2024-02-28 LAB — URINE CULTURE: Culture: >=100000 — AB

## 2024-02-29 NOTE — Progress Notes (Signed)
 ED Antimicrobial Stewardship Positive Culture Follow Up   Scott Fitzgerald. is an 58 y.o. male who presented to [facility] on 02/24/2024 with a chief complaint of nausea.  Chief Complaint  Patient presents with   Nausea    Recent Results (from the past 720 hours)  Urine Culture     Status: Abnormal   Collection Time: 02/24/24 11:33 AM   Specimen: Urine, Clean Catch  Result Value Ref Range Status   Specimen Description   Final    URINE, CLEAN CATCH Performed at [facility], [address], [place], [place] [address]    Special Requests   Final    NONE Performed at [facility], [address] [place], [place] [address]    Culture (A)  Final    >=100,000 COLONIES/mL ENTEROBACTER CLOACAE 50,000 COLONIES/mL ESCHERICHIA COLI    Report Status 02/28/2024 FINAL  Final   Organism ID, Bacteria ENTEROBACTER CLOACAE (A)  Final   Organism ID, Bacteria ESCHERICHIA COLI (A)  Final      Susceptibility   Enterobacter cloacae - MIC*    CEFEPIME <=0.12 SENSITIVE Sensitive     CIPROFLOXACIN <=0.25 SENSITIVE Sensitive     GENTAMICIN <=1 SENSITIVE Sensitive     IMIPENEM 0.5 SENSITIVE Sensitive     NITROFURANTOIN <=16 SENSITIVE Sensitive     TRIMETH/SULFA <=20 SENSITIVE Sensitive     PIP/TAZO <=4 SENSITIVE Sensitive ug/mL    * >=100,000 COLONIES/mL ENTEROBACTER CLOACAE   Escherichia coli - MIC*    AMPICILLIN 8 SENSITIVE Sensitive     CEFAZOLIN <=4 SENSITIVE Sensitive     CEFEPIME <=0.12 SENSITIVE Sensitive     CEFTRIAXONE <=0.25 SENSITIVE Sensitive     CIPROFLOXACIN >=4 RESISTANT Resistant     GENTAMICIN <=1 SENSITIVE Sensitive     IMIPENEM <=0.25 SENSITIVE Sensitive     NITROFURANTOIN <=16 SENSITIVE Sensitive     TRIMETH/SULFA <=20 SENSITIVE Sensitive     AMPICILLIN/SULBACTAM <=2 SENSITIVE Sensitive     PIP/TAZO <=4 SENSITIVE Sensitive ug/mL    * 50,000 COLONIES/mL ESCHERICHIA COLI    [x]  Treated with Cephalexin, organism resistant to prescribed  antimicrobial []  Patient discharged originally without antimicrobial agent and treatment is now indicated  New antibiotic prescription: Bactrim DS BID x 7 days  ED Provider: Nada Boozer, PharmD Pharmacy Resident  02/29/2024 3:08 PM

## 2024-05-10 IMAGING — RF DG CYSTOGRAM 3+V
11 of 18 series · 15 of 24 positions shown · non-contrast
Comparison: None Available.

CLINICAL DATA: Colovesical fistula, evaluate for bladder leak

EXAM:
CYSTOGRAM
TECHNIQUE: After catheterization of the urinary bladder following sterile
technique the bladder was filled with 200 mL Cysto-Hypaque 30% by
drip infusion. Serial spot images were obtained during bladder
filling and post draining.
FLUOROSCOPY:
Radiation Exposure Index (as provided by the fluoroscopic device):
64.6 mGy

[Series 1: t abdomen supine · 0.14mm/px · 1 of 1 slices shown]
[im 1/1]
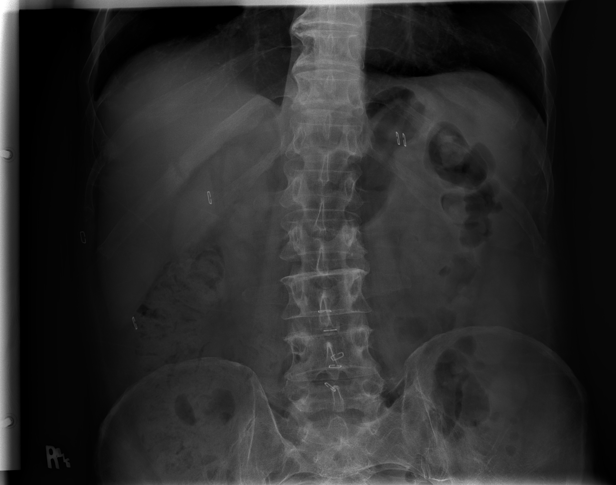

[Series 3: cp_standard · 0.28mm/px · 1 of 1 slices shown (1 of 7)]
[im 1/1]
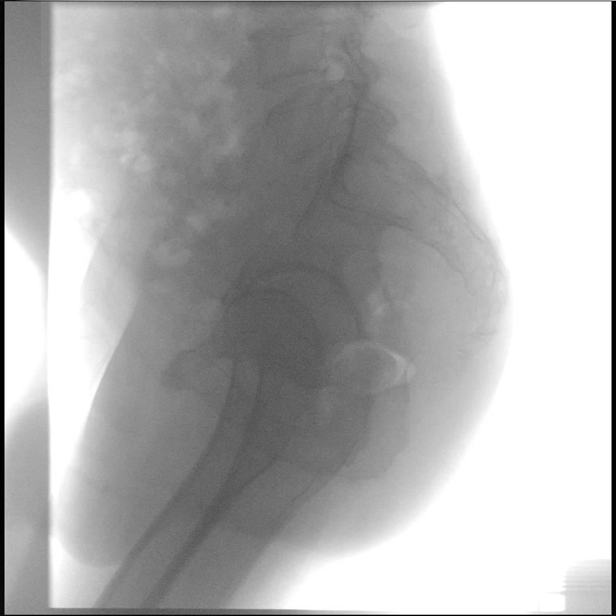

[Series 5: cp_standard · 0.18mm/px · 1 of 1 slices shown (2 of 7)]
[im 1/1]
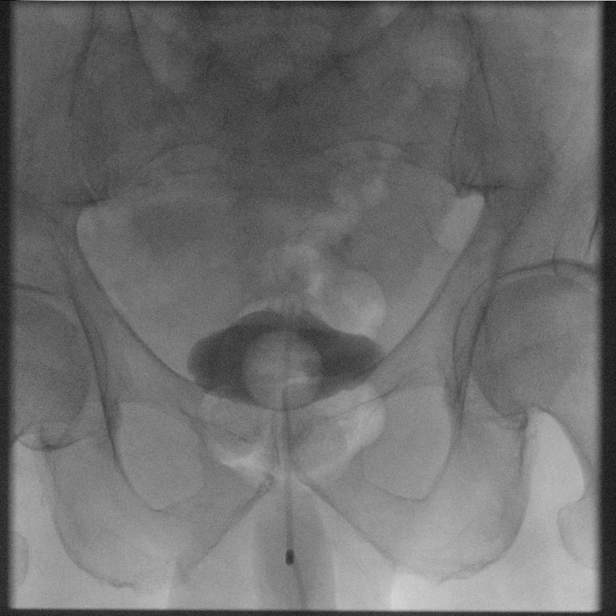

[Series 6: cp_standard · 0.18mm/px · 1 of 1 slices shown (3 of 7)]
[im 1/1]
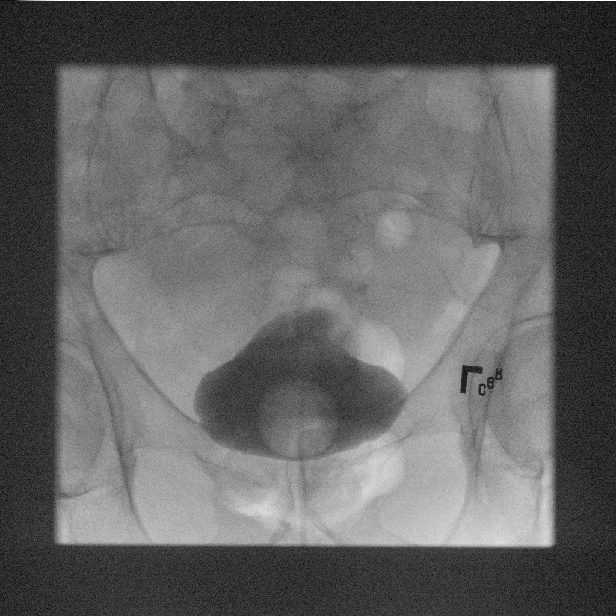

[Series 8: fluoro_iodine_singleshot_bw · 0.18mm/px · 1 of 1 slices shown (1 of 3)]
[im 1/1]
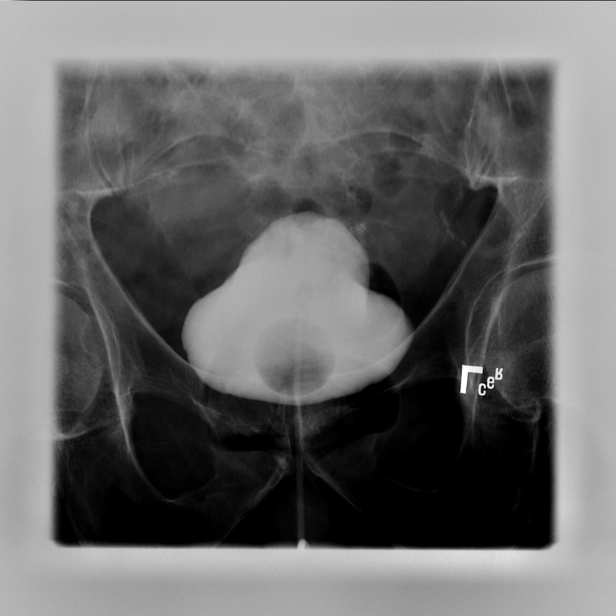

[Series 9: fluoro_iodine_singleshot_bw · 0.18mm/px · 1 of 1 slices shown (2 of 3)]
[im 1/1]
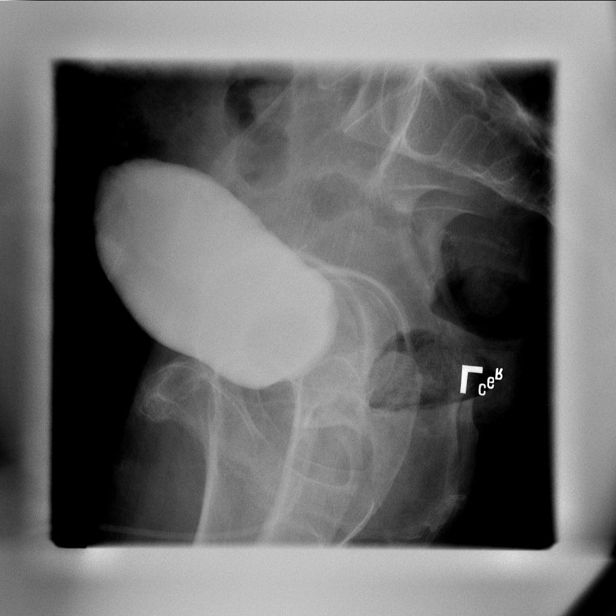

[Series 11: fluoro_iodine_singleshot_bw · 0.18mm/px · 1 of 1 slices shown (3 of 3)]
[im 1/1]
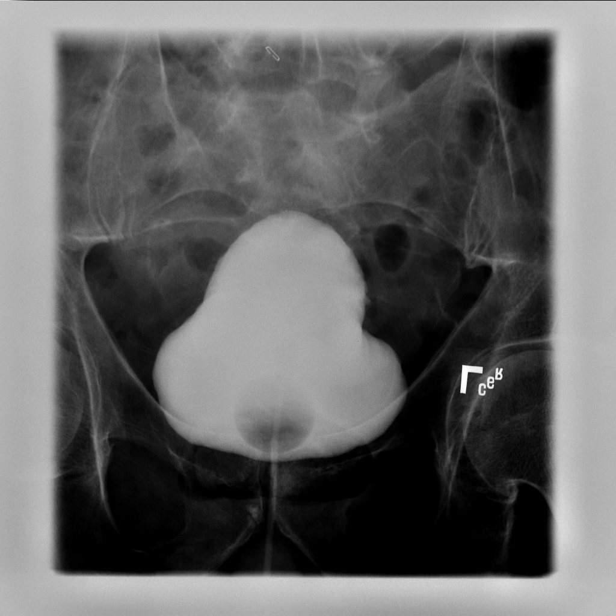

[Series 14: cp_standard · 0.18mm/px · 1 of 1 slices shown (4 of 7)]
[im 1/1]
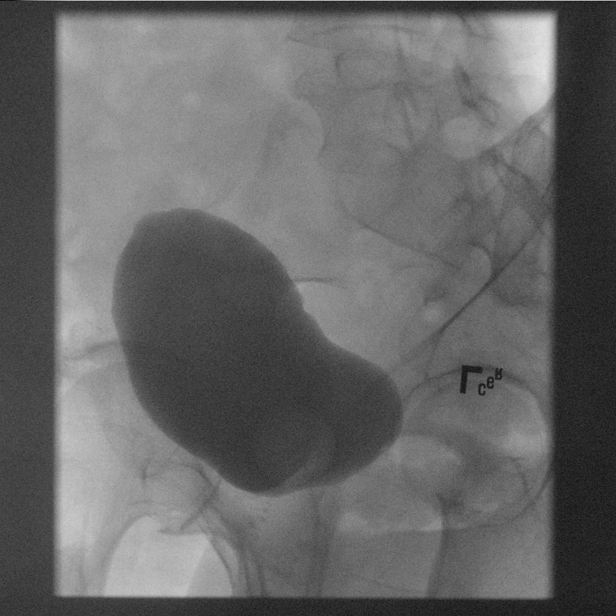

[Series 15: cp_standard · 0.18mm/px · 3 of 67 frames shown (5 of 7)]
[frame 11/67]
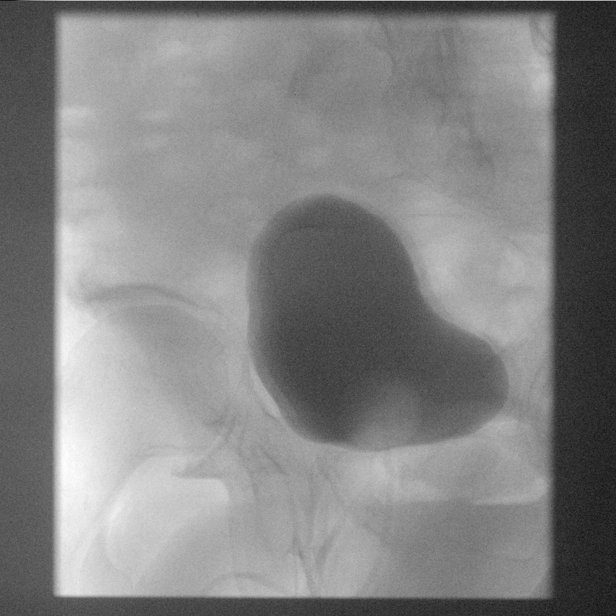
[frame 56/67]
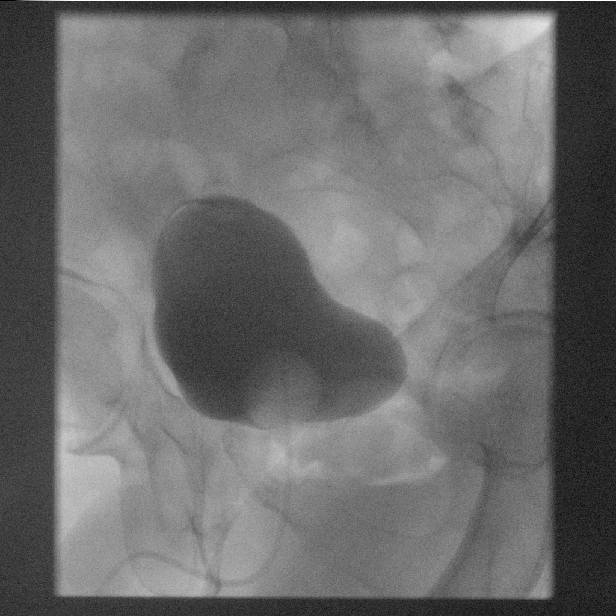
[frame 57/67]
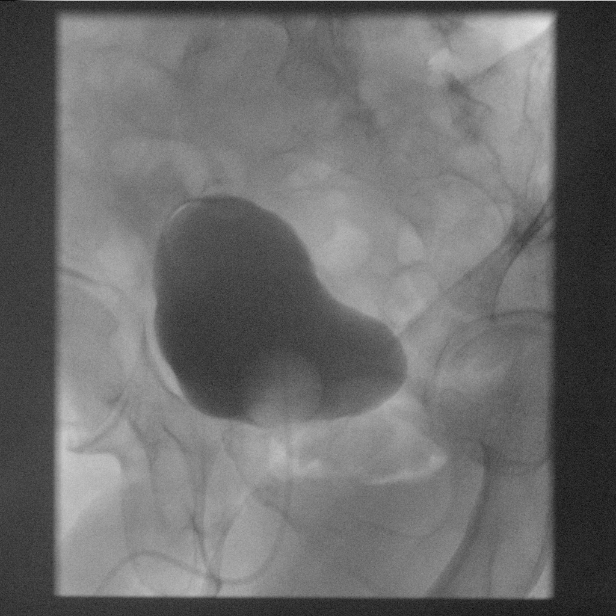

[Series 17: cp_standard · 0.18mm/px · 3 of 43 frames shown (6 of 7)]
[frame 1/43]
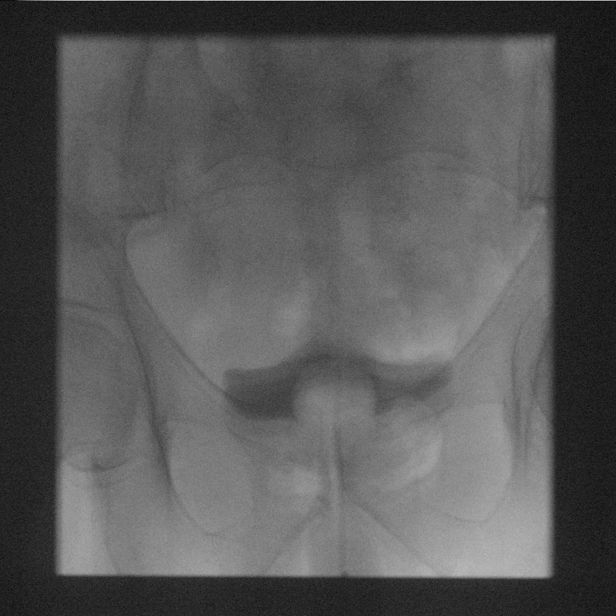
[frame 22/43]
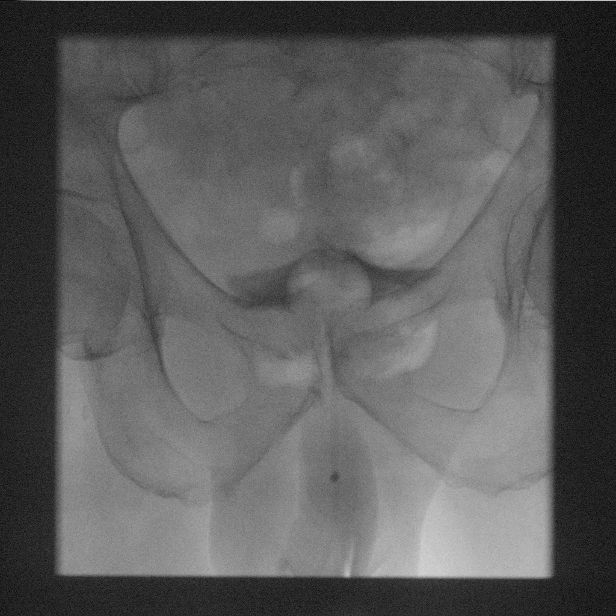
[frame 37/43]
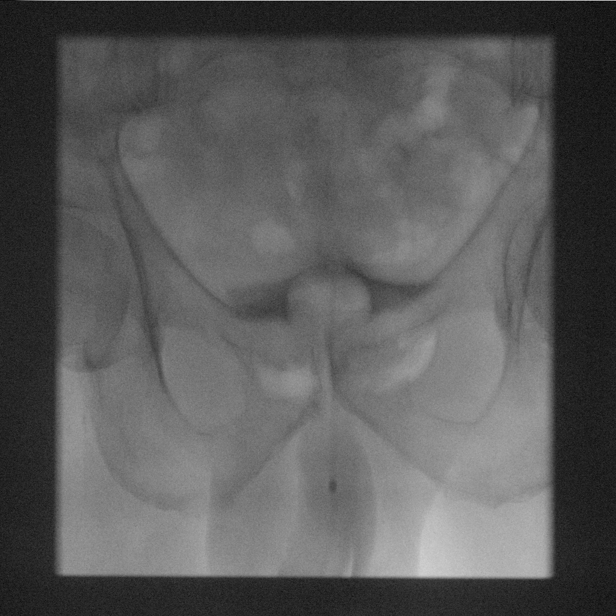

[Series 19: cp_standard · 0.27mm/px · 1 of 1 slices shown (7 of 7)]
[im 1/1]
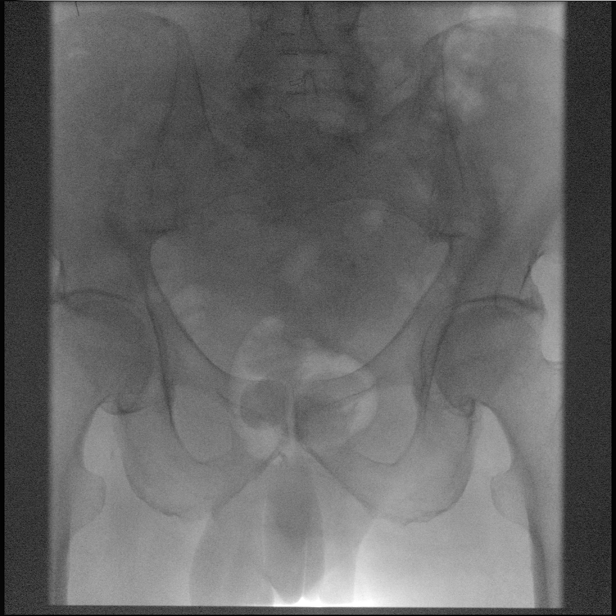

[15 of 24 positions shown; findings below may reference images not displayed]

FINDINGS: Initial scout image demonstrates no focal abnormality. The overlying
bowel gas pattern is normal. No acute osseous abnormality.

A Foley catheter was inserted into the bladder and Cysto-ConrayII
was instilled via gravity. Bladder is normal in capacity. No
vesicoureteral reflux is identified. No filling defects within the
bladder. No extraluminal contrast. No post void residual on post
void images.
IMPRESSION: 1. Normal cystogram. No extraluminal contrast to suggest a leak.

## 2024-09-23 ENCOUNTER — Emergency Department: Payer: Self-pay

## 2024-09-23 ENCOUNTER — Encounter: Payer: Self-pay | Admitting: Emergency Medicine

## 2024-09-23 ENCOUNTER — Emergency Department
Admission: EM | Admit: 2024-09-23 | Discharge: 2024-09-23 | Disposition: A | Discharge status: Home / Self Care | Payer: Self-pay | Attending: Emergency Medicine | Admitting: Emergency Medicine

## 2024-09-23 ENCOUNTER — Other Ambulatory Visit: Payer: Self-pay

## 2024-09-23 DIAGNOSIS — J441 Chronic obstructive pulmonary disease with (acute) exacerbation: Secondary | ICD-10-CM | POA: Insufficient documentation

## 2024-09-23 DIAGNOSIS — Y909 Presence of alcohol in blood, level not specified: Secondary | ICD-10-CM | POA: Insufficient documentation

## 2024-09-23 DIAGNOSIS — F1090 Alcohol use, unspecified, uncomplicated: Secondary | ICD-10-CM | POA: Insufficient documentation

## 2024-09-23 LAB — BASIC METABOLIC PANEL WITH GFR
Anion gap: 14 (ref 5–15)
BUN: 10 mg/dL (ref 6–20)
CO2: 25 mmol/L (ref 22–32)
Calcium: 9.2 mg/dL (ref 8.9–10.3)
Chloride: 98 mmol/L (ref 98–111)
Creatinine, Ser: 0.84 mg/dL (ref 0.61–1.24)
GFR, Estimated: >60 mL/min (ref >60)
Glucose, Bld: 186 mg/dL — ABNORMAL HIGH (ref 70–99)
Potassium: 4 mmol/L (ref 3.5–5.1)
Sodium: 137 mmol/L (ref 135–145)

## 2024-09-23 LAB — CBC
HCT: 43.8 % (ref 39.0–52.0)
Hemoglobin: 15 g/dL (ref 13.0–17.0)
MCH: 32.6 pg (ref 26.0–34.0)
MCHC: 34.2 g/dL (ref 30.0–36.0)
MCV: 95.2 fL (ref 80.0–100.0)
Platelets: 249 K/uL (ref 150–400)
RBC: 4.6 MIL/uL (ref 4.22–5.81)
RDW: 12.3 % (ref 11.5–15.5)
WBC: 8.4 K/uL (ref 4.0–10.5)
nRBC: 0 % (ref 0.0–0.2)

## 2024-09-23 MED ORDER — PREDNISONE 20 MG PO TABS
40.0000 mg | ORAL_TABLET | ORAL | Status: AC
  Administered 2024-09-23: 40 mg via ORAL
  Filled 2024-09-23: qty 2

## 2024-09-23 MED ORDER — ONDANSETRON 4 MG PO TBDP: 4.0000 mg | ORAL_TABLET | Freq: Three times a day (TID) | ORAL | 0 refills | Status: AC | PRN

## 2024-09-23 MED ORDER — NICOTINE 21 MG/24HR TD PT24
21.0000 mg | MEDICATED_PATCH | Freq: Once | TRANSDERMAL | Status: DC
  Administered 2024-09-23: 21 mg via TRANSDERMAL
  Filled 2024-09-23: qty 1

## 2024-09-23 MED ORDER — PREDNISONE 20 MG PO TABS: 40.0000 mg | ORAL_TABLET | Freq: Every day | ORAL | 0 refills | Status: AC

## 2024-09-23 MED ORDER — BUDESONIDE-FORMOTEROL FUMARATE 80-4.5 MCG/ACT IN AERO
2.0000 | INHALATION_SPRAY | Freq: Two times a day (BID) | RESPIRATORY_TRACT | 1 refills | Status: AC
Start: 2024-09-23 — End: 2024-12-22

## 2024-09-23 MED ORDER — IPRATROPIUM-ALBUTEROL 0.5-2.5 (3) MG/3ML IN SOLN
6.0000 mL | Freq: Once | RESPIRATORY_TRACT | Status: AC
  Administered 2024-09-23: 6 mL via RESPIRATORY_TRACT
  Filled 2024-09-23: qty 3

## 2024-09-23 MED ORDER — CHLORDIAZEPOXIDE HCL 5 MG PO CAPS
10.0000 mg | ORAL_CAPSULE | Freq: Once | ORAL | Status: DC
  Filled 2024-09-23: qty 2

## 2024-09-23 NOTE — ED Notes (Signed)
 Patient is resting in a darkened room. No signs of distress.

## 2024-09-23 NOTE — ED Triage Notes (Signed)
 Patient to ED via POV for SOB and nausea. Ongoing x1 weeks. States he has also been feeling nervous and shaking from the inside out. States feeling weak all over. NAD noted. Hx COPD. States daily drinker- drinks 8 beers daily. Last drank yesterday.
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
